# Patient Record
Sex: Male | Born: 1975 | Race: White | Hispanic: No | Marital: Married | Smoking: Current every day smoker
Health system: Southern US, Community
[De-identification: ages and names within clinical notes are randomized; demographics above are authoritative.]

## PROBLEM LIST (undated history)

## (undated) DIAGNOSIS — E785 Hyperlipidemia, unspecified: Secondary | ICD-10-CM

## (undated) DIAGNOSIS — F32A Depression, unspecified: Secondary | ICD-10-CM

## (undated) DIAGNOSIS — F1910 Other psychoactive substance abuse, uncomplicated: Secondary | ICD-10-CM

## (undated) DIAGNOSIS — F329 Major depressive disorder, single episode, unspecified: Secondary | ICD-10-CM

## (undated) DIAGNOSIS — F419 Anxiety disorder, unspecified: Secondary | ICD-10-CM

## (undated) DIAGNOSIS — E079 Disorder of thyroid, unspecified: Secondary | ICD-10-CM

---

## 2006-06-10 ENCOUNTER — Emergency Department (HOSPITAL_COMMUNITY): Admission: EM | Admit: 2006-06-10 | Discharge: 2006-06-10 | Payer: Self-pay | Admitting: Emergency Medicine

## 2007-10-16 ENCOUNTER — Emergency Department (HOSPITAL_COMMUNITY): Admission: EM | Admit: 2007-10-16 | Discharge: 2007-10-17 | Payer: Self-pay | Admitting: Emergency Medicine

## 2010-03-27 ENCOUNTER — Emergency Department (HOSPITAL_COMMUNITY): Admission: EM | Admit: 2010-03-27 | Discharge: 2010-03-27 | Payer: Self-pay | Admitting: Emergency Medicine

## 2010-11-02 LAB — COMPREHENSIVE METABOLIC PANEL
Albumin: 4 g/dL (ref 3.5–5.2)
Alkaline Phosphatase: 82 U/L (ref 39–117)
BUN: 15 mg/dL (ref 6–23)
CO2: 25 mEq/L (ref 19–32)
Calcium: 9.1 mg/dL (ref 8.4–10.5)
Chloride: 102 mEq/L (ref 96–112)
Potassium: 3.6 mEq/L (ref 3.5–5.1)
Total Bilirubin: 1.5 mg/dL — ABNORMAL HIGH (ref 0.3–1.2)

## 2010-11-02 LAB — RAPID URINE DRUG SCREEN, HOSP PERFORMED: Amphetamines: NOT DETECTED

## 2010-11-02 LAB — CBC
MCH: 33.5 pg (ref 26.0–34.0)
MCHC: 35.9 g/dL (ref 30.0–36.0)
MCV: 93.5 fL (ref 78.0–100.0)
Platelets: 227 10*3/uL (ref 150–400)
RDW: 13.5 % (ref 11.5–15.5)
WBC: 13.8 10*3/uL — ABNORMAL HIGH (ref 4.0–10.5)

## 2010-11-02 LAB — URINALYSIS, ROUTINE W REFLEX MICROSCOPIC
Protein, ur: NEGATIVE mg/dL
Urobilinogen, UA: 1 mg/dL (ref 0.0–1.0)
pH: 6.5 (ref 5.0–8.0)

## 2011-05-10 LAB — BASIC METABOLIC PANEL
CO2: 23
Chloride: 101
GFR calc Af Amer: >60

## 2011-05-10 LAB — RAPID URINE DRUG SCREEN, HOSP PERFORMED
Barbiturates: NOT DETECTED
Benzodiazepines: POSITIVE — AB
Cocaine: POSITIVE — AB
Opiates: NOT DETECTED
Tetrahydrocannabinol: NOT DETECTED

## 2011-05-10 LAB — DIFFERENTIAL
Eosinophils Absolute: 0
Lymphocytes Relative: 24
Monocytes Absolute: 1.3 — ABNORMAL HIGH
Monocytes Relative: 12
Neutro Abs: 6.9
Neutrophils Relative %: 63

## 2011-05-10 LAB — CBC
MCV: 93.6
Platelets: 271
WBC: 10.9 — ABNORMAL HIGH

## 2011-05-10 LAB — URINALYSIS, ROUTINE W REFLEX MICROSCOPIC
Glucose, UA: NEGATIVE
Nitrite: NEGATIVE
Protein, ur: NEGATIVE
Specific Gravity, Urine: 1.031 — ABNORMAL HIGH
Urobilinogen, UA: 1
pH: 5.5

## 2011-05-10 LAB — URINE MICROSCOPIC-ADD ON

## 2012-04-27 ENCOUNTER — Ambulatory Visit: Payer: Self-pay | Admitting: Emergency Medicine

## 2012-04-27 VITALS — BP 120/68 | HR 68 | Temp 98.0°F | Resp 16 | Ht 76.0 in | Wt 228.0 lb

## 2012-04-27 DIAGNOSIS — Z0289 Encounter for other administrative examinations: Secondary | ICD-10-CM

## 2012-04-27 DIAGNOSIS — Z2839 Other underimmunization status: Secondary | ICD-10-CM

## 2012-04-27 NOTE — Progress Notes (Signed)
  Subjective:    Patient ID: Brandon [name], male    DOB: 16-Jul-1976, 36 y.o.   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 He has some records with him but not all of his immunization. Patient has no ongoing medical problems. He has a history of substance abuse but is currently been clean for over a year after attending rehabilitation.   Review of Systems     Objective:   Physical Exam HEENT exam is unremarkable. His neck supple chest is clear to patient is soft nontender.        Assessment & Plan:  I reviewed his immunization record. He is lacking and MMR did not have any contact to give titers for hepatitis B as well as titers for varicella.

## 2012-04-28 LAB — VARICELLA ZOSTER ANTIBODY, IGG: Varicella IgG: 2.53 {ISR} — ABNORMAL HIGH (ref <0.90)

## 2012-06-17 ENCOUNTER — Emergency Department (HOSPITAL_COMMUNITY)
Admission: EM | Admit: 2012-06-17 | Discharge: 2012-06-18 | Disposition: A | Discharge status: Home / Self Care | Payer: BC Managed Care – PPO | Attending: Emergency Medicine | Admitting: Emergency Medicine

## 2012-06-17 DIAGNOSIS — F1910 Other psychoactive substance abuse, uncomplicated: Secondary | ICD-10-CM

## 2012-06-17 DIAGNOSIS — F1410 Cocaine abuse, uncomplicated: Secondary | ICD-10-CM | POA: Insufficient documentation

## 2012-06-17 DIAGNOSIS — Z79899 Other long term (current) drug therapy: Secondary | ICD-10-CM | POA: Insufficient documentation

## 2012-06-17 DIAGNOSIS — F1010 Alcohol abuse, uncomplicated: Secondary | ICD-10-CM | POA: Insufficient documentation

## 2012-06-17 DIAGNOSIS — F17200 Nicotine dependence, unspecified, uncomplicated: Secondary | ICD-10-CM | POA: Insufficient documentation

## 2012-06-17 NOTE — ED Notes (Signed)
Pt called for triage  No response noted from lobby

## 2012-06-18 ENCOUNTER — Encounter (HOSPITAL_COMMUNITY): Payer: Self-pay | Admitting: Emergency Medicine

## 2012-06-18 LAB — CBC
Hemoglobin: 15.4 g/dL (ref 13.0–17.0)
MCHC: 35.6 g/dL (ref 30.0–36.0)
Platelets: 275 10*3/uL (ref 150–400)
RBC: 4.88 MIL/uL (ref 4.22–5.81)
RDW: 12.7 % (ref 11.5–15.5)
WBC: 11.7 10*3/uL — ABNORMAL HIGH (ref 4.0–10.5)

## 2012-06-18 LAB — COMPREHENSIVE METABOLIC PANEL
CO2: 22 mEq/L (ref 19–32)
Calcium: 9.5 mg/dL (ref 8.4–10.5)
Chloride: 95 mEq/L — ABNORMAL LOW (ref 96–112)
Creatinine, Ser: 1.11 mg/dL (ref 0.50–1.35)
GFR calc non Af Amer: 85 mL/min — ABNORMAL LOW (ref >90)
Glucose, Bld: 88 mg/dL (ref 70–99)
Potassium: 4.4 mEq/L (ref 3.5–5.1)
Total Bilirubin: 1.6 mg/dL — ABNORMAL HIGH (ref 0.3–1.2)

## 2012-06-18 LAB — RAPID URINE DRUG SCREEN, HOSP PERFORMED
Amphetamines: NOT DETECTED
Benzodiazepines: NOT DETECTED
Cocaine: POSITIVE — AB
Opiates: NOT DETECTED
Tetrahydrocannabinol: NOT DETECTED

## 2012-06-18 LAB — ETHANOL: Alcohol, Ethyl (B): 70 mg/dL — ABNORMAL HIGH (ref 0–11)

## 2012-06-18 LAB — SALICYLATE LEVEL: Salicylate Lvl: <2 mg/dL — ABNORMAL LOW (ref 2.8–20.0)

## 2012-06-18 MED ORDER — LORAZEPAM 1 MG PO TABS
0.0000 mg | ORAL_TABLET | Freq: Four times a day (QID) | ORAL | Status: DC
  Administered 2012-06-18: 1 mg via ORAL
  Administered 2012-06-18: 2 mg via ORAL
  Filled 2012-06-18: qty 2
  Filled 2012-06-18: qty 1

## 2012-06-18 MED ORDER — LORAZEPAM 1 MG PO TABS: 1.0000 mg | ORAL_TABLET | Freq: Three times a day (TID) | ORAL | Status: DC | PRN

## 2012-06-18 MED ORDER — NICOTINE 21 MG/24HR TD PT24
21.0000 mg | MEDICATED_PATCH | Freq: Once | TRANSDERMAL | Status: DC
  Administered 2012-06-18: 21 mg via TRANSDERMAL
  Filled 2012-06-18: qty 1

## 2012-06-18 MED ORDER — PANTOPRAZOLE SODIUM 40 MG PO TBEC
40.0000 mg | DELAYED_RELEASE_TABLET | Freq: Every day | ORAL | Status: DC
  Administered 2012-06-18: 40 mg via ORAL
  Filled 2012-06-18: qty 1

## 2012-06-18 MED ORDER — VITAMIN B-1 100 MG PO TABS
100.0000 mg | ORAL_TABLET | Freq: Every day | ORAL | Status: DC
  Administered 2012-06-18: 100 mg via ORAL
  Filled 2012-06-18: qty 1

## 2012-06-18 MED ORDER — IBUPROFEN 600 MG PO TABS: 600.0000 mg | ORAL_TABLET | Freq: Three times a day (TID) | ORAL | Status: DC | PRN

## 2012-06-18 MED ORDER — LORAZEPAM 2 MG/ML IJ SOLN: 1.0000 mg | Freq: Four times a day (QID) | INTRAMUSCULAR | Status: DC | PRN

## 2012-06-18 MED ORDER — ACETAMINOPHEN 325 MG PO TABS: 650.0000 mg | ORAL_TABLET | ORAL | Status: DC | PRN

## 2012-06-18 MED ORDER — ALUM & MAG HYDROXIDE-SIMETH 200-200-20 MG/5ML PO SUSP: 30.0000 mL | ORAL | Status: DC | PRN

## 2012-06-18 MED ORDER — FOLIC ACID 1 MG PO TABS
1.0000 mg | ORAL_TABLET | Freq: Every day | ORAL | Status: DC
  Administered 2012-06-18: 1 mg via ORAL
  Filled 2012-06-18: qty 1

## 2012-06-18 MED ORDER — DULOXETINE HCL 60 MG PO CPEP
60.0000 mg | ORAL_CAPSULE | Freq: Every day | ORAL | Status: DC
  Administered 2012-06-18: 60 mg via ORAL
  Filled 2012-06-18 (×2): qty 1

## 2012-06-18 MED ORDER — NICOTINE 21 MG/24HR TD PT24: 21.0000 mg | MEDICATED_PATCH | Freq: Every day | TRANSDERMAL | Status: DC

## 2012-06-18 MED ORDER — ADULT MULTIVITAMIN W/MINERALS CH
1.0000 | ORAL_TABLET | Freq: Every day | ORAL | Status: DC
  Administered 2012-06-18: 1 via ORAL
  Filled 2012-06-18: qty 1

## 2012-06-18 MED ORDER — LORAZEPAM 1 MG PO TABS
1.0000 mg | ORAL_TABLET | Freq: Four times a day (QID) | ORAL | Status: DC | PRN
  Administered 2012-06-18: 1 mg via ORAL
  Filled 2012-06-18: qty 1

## 2012-06-18 MED ORDER — THIAMINE HCL 100 MG/ML IJ SOLN: 100.0000 mg | Freq: Every day | INTRAMUSCULAR | Status: DC

## 2012-06-18 MED ORDER — LORAZEPAM 1 MG PO TABS: 0.0000 mg | ORAL_TABLET | Freq: Two times a day (BID) | ORAL | Status: DC

## 2012-06-18 MED ORDER — ONDANSETRON HCL 4 MG PO TABS
4.0000 mg | ORAL_TABLET | Freq: Three times a day (TID) | ORAL | Status: DC | PRN
  Administered 2012-06-18: 4 mg via ORAL
  Filled 2012-06-18: qty 1

## 2012-06-18 MED ORDER — LORAZEPAM 1 MG PO TABS
1.0000 mg | ORAL_TABLET | Freq: Once | ORAL | Status: AC
  Administered 2012-06-18: 1 mg via ORAL
  Filled 2012-06-18: qty 1

## 2012-06-18 MED ORDER — ZOLPIDEM TARTRATE 5 MG PO TABS: 5.0000 mg | ORAL_TABLET | Freq: Every evening | ORAL | Status: DC | PRN

## 2012-06-18 MED ORDER — MENTHOL 3 MG MT LOZG
1.0000 | LOZENGE | OROMUCOSAL | Status: DC | PRN
  Filled 2012-06-18: qty 9

## 2012-06-18 NOTE — ED Provider Notes (Signed)
History     CSN: [id number]  Arrival date & time 06/17/12  2349   First MD Initiated Contact with Patient 06/18/12 0320      Chief Complaint  Patient presents with  . Medical Clearance    (Consider location/radiation/quality/duration/timing/severity/associated sxs/prior treatment) HPI HX per PT. clean for 14 months, full time student, family in town, relapsed 3 days ago, wants detox from crack, alcohol.  He denies any other drugs, last drink tonight, last drug use about one hour PTA. No sleep in 3 days, depressed and anxious, no active SI.  History reviewed. No pertinent past medical history.  History reviewed. No pertinent past surgical history.  Family History  Problem Relation Age of Onset  . Diabetes Father     History  Substance Use Topics  . Smoking status: Current Every Day Smoker    Types: Cigarettes  . Smokeless tobacco: Not on file  . Alcohol Use: Yes      Review of Systems  Constitutional: Negative for fever and chills.  HENT: Negative for neck pain and neck stiffness.   Eyes: Negative for pain.  Respiratory: Negative for shortness of breath.   Cardiovascular: Negative for chest pain.  Gastrointestinal: Negative for abdominal pain.  Genitourinary: Negative for dysuria.  Musculoskeletal: Negative for back pain.  Skin: Negative for rash.  Neurological: Negative for headaches.  Psychiatric/Behavioral: Negative for self-injury.  All other systems reviewed and are negative.    Allergies  Review of patient's allergies indicates no known allergies.  Home Medications   Current Outpatient Rx  Name Route Sig Dispense Refill  . DULOXETINE HCL 60 MG PO CPEP Oral Take 60 mg by mouth daily.    Marland Kitchen OMEPRAZOLE 20 MG PO CPDR Oral Take 20 mg by mouth daily.      BP 130/86  Pulse 110  Temp 99.6 F (37.6 C) (Oral)  Resp 18  SpO2 100%  Physical Exam  Constitutional: He is oriented to person, place, and time. He appears well-developed and well-nourished.    HENT:  Head: Normocephalic and atraumatic.  Eyes: Conjunctivae normal and EOM are normal. Pupils are equal, round, and reactive to light.  Neck: Trachea normal. Neck supple. No thyromegaly present.  Cardiovascular: Normal rate, regular rhythm, S1 normal, S2 normal and normal pulses.     No systolic murmur is present   No diastolic murmur is present  Pulses:      Radial pulses are 2+ on the right side, and 2+ on the left side.  Pulmonary/Chest: Effort normal and breath sounds normal. He has no wheezes. He has no rhonchi. He has no rales. He exhibits no tenderness.  Abdominal: Soft. Normal appearance and bowel sounds are normal. There is no tenderness. There is no CVA tenderness and negative Murphy's sign.  Musculoskeletal:       UE tremors  Neurological: He is alert and oriented to person, place, and time. He has normal strength. No cranial nerve deficit or sensory deficit. GCS eye subscore is 4. GCS verbal subscore is 5. GCS motor subscore is 6.  Skin: Skin is warm and dry. No rash noted. He is not diaphoretic.  Psychiatric: His speech is normal.       anxious    ED Course  Procedures (including critical care time)  Results for orders placed during the hospital encounter of 06/17/12  ACETAMINOPHEN LEVEL      Component Value Range   Acetaminophen (Tylenol), Serum <15.0  10 - 30 ug/mL  CBC  Component Value Range   WBC 11.7 (*) 4.0 - 10.5 K/uL   RBC 4.88  4.22 - 5.81 MIL/uL   Hemoglobin 15.4  13.0 - 17.0 g/dL   HCT 56.2  13.0 - 86.5 %   MCV 88.7  78.0 - 100.0 fL   MCH 31.6  26.0 - 34.0 pg   MCHC 35.6  30.0 - 36.0 g/dL   RDW 78.4  69.6 - 29.5 %   Platelets 275  150 - 400 K/uL  COMPREHENSIVE METABOLIC PANEL      Component Value Range   Sodium 136  135 - 145 mEq/L   Potassium 4.4  3.5 - 5.1 mEq/L   Chloride 95 (*) 96 - 112 mEq/L   CO2 22  19 - 32 mEq/L   Glucose, Bld 88  70 - 99 mg/dL   BUN 28 (*) 6 - 23 mg/dL   Creatinine, Ser 2.84  0.50 - 1.35 mg/dL   Calcium 9.5  8.4  - 13.2 mg/dL   Total Protein 7.7  6.0 - 8.3 g/dL   Albumin 4.8  3.5 - 5.2 g/dL   AST 92 (*) 0 - 37 U/L   ALT 49  0 - 53 U/L   Alkaline Phosphatase 78  39 - 117 U/L   Total Bilirubin 1.6 (*) 0.3 - 1.2 mg/dL   GFR calc non Af Amer 85 (*) >90 mL/min   GFR calc Af Amer >90  >90 mL/min  ETHANOL      Component Value Range   Alcohol, Ethyl (B) 70 (*) 0 - 11 mg/dL  SALICYLATE LEVEL      Component Value Range   Salicylate Lvl <2.0 (*) 2.8 - 20.0 mg/dL  URINE RAPID DRUG SCREEN (HOSP PERFORMED)      Component Value Range   Opiates NONE DETECTED  NONE DETECTED   Cocaine POSITIVE (*) NONE DETECTED   Benzodiazepines NONE DETECTED  NONE DETECTED   Amphetamines NONE DETECTED  NONE DETECTED   Tetrahydrocannabinol NONE DETECTED  NONE DETECTED   Barbiturates NONE DETECTED  NONE DETECTED   ACT notified - will assess for possible placement. ED [facility] holding orders and withdrawal orders initiated.   MDM    Polysubstance abuse requesting help with detox. No SI/HI. Labs, UA and UDS reviewed.         Sunnie Nielsen, MD 06/19/12 [phone]

## 2012-06-18 NOTE — ED Notes (Signed)
MD at bedside. 

## 2012-06-18 NOTE — ED Provider Notes (Signed)
2145.  Pt stable, NAD.  He has been provided a resource guide to assist with obtaining further assistance with substance abuse issues.  He has no issues with SI/HI.  Plan discharge home.  Tobin Chad, MD 06/18/12 2146

## 2012-06-18 NOTE — ED Notes (Signed)
Pt states he is here for detox from alcohol and cocaine  Pt states he was clean for 14 months  Pt states he has been using for the past 3 days   Pt states last drank about an hour and a half ago and last used cocaine about 2 hrs ago

## 2012-06-18 NOTE — ED Notes (Signed)
The ativan 1mg  was given PO, not IV A. Hart Rochester, RN

## 2012-06-18 NOTE — ED Notes (Signed)
Patient discharge with written and verbal instructions. Respirations equal and unlabored. Skin warm and dry. No acute distress noted.

## 2012-06-18 NOTE — ED Notes (Signed)
Patient states he's here for detox from crack and alcohol. He states his last detox was 2 years ago and he was sober for 14 months. He relates a lot of personal problems. He states he last used just prior to arrival. He denies any symptoms of withdrawal at this point has no tremor. He is waiting to have act team assessment this morning.  Ward Givens, MD 06/18/12 [phone]

## 2012-06-19 NOTE — BH Assessment (Signed)
Assessment Note   Brandon [name] is a 36 y.o. male who presents to [facility] with alcohol and crack abuse.  Pt denies SI/HI/Psych.  Pt told this Clinical research associate that he has been sober for 14 months and relapsed recently because pressures from school and work.  Pt was employed with [employer] and has been terminated due to relapse--"I haven't been taking care of myself".  Pt says he's attending [employer] school of social work.  Pt says he relapsed 3 days ago and has been drinking 1/2 gal of rum for 3 days.  Pt was previously a patient with [employer] and spent 3 mos in rehab and also was residing at the [facility].  Pt was requesting help with Rehab, this writer advised pt that behavorial health cannot assist with placement but can provide referrals and pt can pursue rehab.  Pt given referrals and d/c.   Axis I: Substance Abuse Axis II: Deferred Axis III: History reviewed. No pertinent past medical history. Axis IV: educational problems, other psychosocial or environmental problems, problems related to social environment and problems with primary support group Axis V: 51-60 moderate symptoms  Past Medical History: History reviewed. No pertinent past medical history.  History reviewed. No pertinent past surgical history.  Family History:  Family History  Problem Relation Age of Onset  . Diabetes Father     Social History:  reports that he has been smoking Cigarettes.  He does not have any smokeless tobacco history on file. He reports that he drinks alcohol. He reports that he uses illicit drugs ("Crack" cocaine).  Additional Social History:  Alcohol / Drug Use Pain Medications: None  Prescriptions: None  Over the Counter: None  History of alcohol / drug use?: Yes Longest period of sobriety (when/how long): 14 mos  Negative Consequences of Use: Personal relationships;Work / Mining engineer #1 Name of Substance 1: Alcohol  1 - Age of First Use: Teens  1 - Amount (size/oz): 1/2 Gal  1 -  Frequency: Daily  1 - Duration: 3 Days  1 - Last Use / Amount: 06/18/12 Substance #2 Name of Substance 2: Crack  2 - Age of First Use: Teens  2 - Amount (size/oz): Unk  2 - Frequency: Unk  2 - Duration: 3 Days  2 - Last Use / Amount: 06/18/12  CIWA: CIWA-Ar BP: 93/58 mmHg Pulse Rate: 90  Nausea and Vomiting: no nausea and no vomiting Tactile Disturbances: none Tremor: no tremor Auditory Disturbances: not present Paroxysmal Sweats: no sweat visible Visual Disturbances: not present Anxiety: no anxiety, at ease Headache, Fullness in Head: none present Agitation: normal activity Orientation and Clouding of Sensorium: oriented and can do serial additions CIWA-Ar Total: 0  COWS:    Allergies: No Known Allergies  Home Medications:  (Not in a hospital admission)  OB/GYN Status:  No LMP for male patient.  General Assessment Data Location of Assessment: [facility] ED Living Arrangements: Alone Can pt return to current living arrangement?: Yes Admission Status: Voluntary Is patient capable of signing voluntary admission?: Yes Transfer from: Acute Hospital Referral Source: MD  Education Status Is patient currently in school?: No Current Grade: None  Highest grade of school patient has completed: None  Name of school: None  Contact person: None   Risk to self Suicidal Ideation: No Suicidal Intent: No Is patient at risk for suicide?: No Suicidal Plan?: No Access to Means: No What has been your use of drugs/alcohol within the last 12 months?: Abusing alcohol/crack  Previous Attempts/Gestures: No How  many times?: 0  Other Self Harm Risks: None  Triggers for Past Attempts: None known Intentional Self Injurious Behavior: None Family Suicide History: No Recent stressful life event(s): Other (Comment) (Relapsed on Alcohol/Crack ) Persecutory voices/beliefs?: No Depression: Yes Depression Symptoms: Guilt Substance abuse history and/or treatment for substance abuse?: Yes Suicide  prevention information given to non-admitted patients: Not applicable  Risk to Others Homicidal Ideation: No Thoughts of Harm to Others: No Current Homicidal Intent: No Current Homicidal Plan: No Access to Homicidal Means: No Identified Victim: None  History of harm to others?: No Assessment of Violence: None Noted Violent Behavior Description: None  Does patient have access to weapons?: No Criminal Charges Pending?: No Does patient have a court date: No  Psychosis Hallucinations: None noted Delusions: None noted  Mental Status Report Appear/Hygiene: Disheveled Eye Contact: Poor Motor Activity: Unremarkable Speech: Logical/coherent Level of Consciousness: Alert Mood: Depressed Affect: Depressed;Appropriate to circumstance Anxiety Level: None Thought Processes: Coherent;Relevant Judgement: Unimpaired Orientation: Person;Place;Time;Situation Obsessive Compulsive Thoughts/Behaviors: None  Cognitive Functioning Concentration: Normal Memory: Recent Intact;Remote Intact IQ: Average Insight: Fair Impulse Control: Fair Appetite: Good Weight Loss: 0  Weight Gain: 0  Sleep: No Change Total Hours of Sleep: 6  Vegetative Symptoms: None  ADLScreening [facility] Assessment Services) Patient's cognitive ability adequate to safely complete daily activities?: Yes Patient able to express need for assistance with ADLs?: Yes Independently performs ADLs?: Yes (appropriate for developmental age)  Abuse/Neglect [facility]) Physical Abuse: Denies Verbal Abuse: Denies Sexual Abuse: Denies  Prior Inpatient Therapy Prior Inpatient Therapy: Yes Prior Therapy Dates: 2013 Prior Therapy Facilty/Provider(s): [facility] [place], [place]([address]) Reason for Treatment: Detox/rehab   Prior Outpatient Therapy Prior Outpatient Therapy: No Prior Therapy Dates: None  Prior Therapy Facilty/Provider(s): None  Reason for Treatment: None   ADL Screening (condition at time of admission) Patient's  cognitive ability adequate to safely complete daily activities?: Yes Patient able to express need for assistance with ADLs?: Yes Independently performs ADLs?: Yes (appropriate for developmental age) Weakness of Legs: None Weakness of Arms/Hands: None  Home Assistive Devices/Equipment Home Assistive Devices/Equipment: None  Therapy Consults (therapy consults require a physician order) PT Evaluation Needed: No OT Evalulation Needed: No SLP Evaluation Needed: No Abuse/Neglect Assessment (Assessment to be complete while patient is alone) Physical Abuse: Denies Verbal Abuse: Denies Sexual Abuse: Denies Exploitation of patient/patient's resources: Denies Self-Neglect: Denies Values / Beliefs Cultural Requests During Hospitalization: None Spiritual Requests During Hospitalization: None Consults Spiritual Care Consult Needed: No Social Work Consult Needed: No Merchant navy officer (For Healthcare) Advance Directive: Patient does not have advance directive;Patient would not like information Pre-existing out of facility DNR order (yellow form or pink MOST form): No Nutrition Screen- [facility] Patient's home diet: Regular Have you recently lost weight without trying?: No Have you been eating poorly because of a decreased appetite?: No Malnutrition Screening Tool Score: 0   Additional Information 1:1 In Past 12 Months?: No CIRT Risk: No Elopement Risk: No Does patient have medical clearance?: Yes     Disposition:  Disposition Disposition of Patient: Referred to;Outpatient treatment (Outpatient rehab referrals ) Type of outpatient treatment:  (Outpatient referrals) Patient referred to: Outpatient clinic referral  On Site Evaluation by:   Reviewed with Physician:     Beatrix Shipper C 06/19/2012 1:04 AM

## 2015-03-13 ENCOUNTER — Encounter: Payer: Self-pay | Admitting: Emergency Medicine

## 2015-03-13 ENCOUNTER — Emergency Department (INDEPENDENT_AMBULATORY_CARE_PROVIDER_SITE_OTHER): Payer: Self-pay

## 2015-03-13 ENCOUNTER — Emergency Department
Admission: EM | Admit: 2015-03-13 | Discharge: 2015-03-13 | Disposition: A | Discharge status: Home / Self Care | Payer: Self-pay | Source: Home / Self Care | Attending: Family Medicine | Admitting: Family Medicine

## 2015-03-13 DIAGNOSIS — S0003XA Contusion of scalp, initial encounter: Secondary | ICD-10-CM

## 2015-03-13 DIAGNOSIS — R42 Dizziness and giddiness: Secondary | ICD-10-CM

## 2015-03-13 DIAGNOSIS — S2232XA Fracture of one rib, left side, initial encounter for closed fracture: Secondary | ICD-10-CM

## 2015-03-13 DIAGNOSIS — X58XXXA Exposure to other specified factors, initial encounter: Secondary | ICD-10-CM

## 2015-03-13 DIAGNOSIS — T148 Other injury of unspecified body region: Secondary | ICD-10-CM

## 2015-03-13 DIAGNOSIS — T148XXA Other injury of unspecified body region, initial encounter: Secondary | ICD-10-CM

## 2015-03-13 DIAGNOSIS — T07XXXA Unspecified multiple injuries, initial encounter: Secondary | ICD-10-CM

## 2015-03-13 HISTORY — DX: Depression, unspecified: F32.A

## 2015-03-13 HISTORY — DX: Major depressive disorder, single episode, unspecified: F32.9

## 2015-03-13 HISTORY — DX: Other psychoactive substance abuse, uncomplicated: F19.10

## 2015-03-13 HISTORY — DX: Anxiety disorder, unspecified: F41.9

## 2015-03-13 HISTORY — DX: Hyperlipidemia, unspecified: E78.5

## 2015-03-13 HISTORY — DX: Disorder of thyroid, unspecified: E07.9

## 2015-03-13 MED ORDER — ONDANSETRON 4 MG PO TBDP: 4.0000 mg | ORAL_TABLET | Freq: Once | ORAL | Status: DC

## 2015-03-13 MED ORDER — MELOXICAM 7.5 MG PO TABS: ORAL_TABLET | ORAL | Status: DC

## 2015-03-13 MED ORDER — CYCLOBENZAPRINE HCL 10 MG PO TABS: 10.0000 mg | ORAL_TABLET | Freq: Two times a day (BID) | ORAL | Status: DC | PRN

## 2015-03-13 MED ORDER — ONDANSETRON HCL 4 MG PO TABS: 4.0000 mg | ORAL_TABLET | Freq: Four times a day (QID) | ORAL | Status: DC
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
## 2015-03-13 NOTE — Discharge Instructions (Signed)
Flexeril is a muscle relaxer and may cause drowsiness. Do not drink alcohol, drive, or operate heavy machinery while taking. Meloxicam (Mobic) is an antiinflammatory to help with pain and inflammation.  Do not take ibuprofen, Advil, Aleve, or any other medications that contain NSAIDs while taking meloxicam as this may cause stomach upset or even ulcers if taken in large amounts for an extended period of time.   Please follow-up with a primary care provider in one week if not improving.   Please call 911 or go to closest emergency department if symptoms significantly worsen including difficulty breathing, difficulty walking or other new concerning symptoms of

## 2017-10-13 ENCOUNTER — Emergency Department (HOSPITAL_COMMUNITY)
Admission: EM | Admit: 2017-10-13 | Discharge: 2017-10-14 | Disposition: A | Discharge status: Other Acute Inpatient Hospital | Payer: Self-pay | Attending: Emergency Medicine | Admitting: Emergency Medicine

## 2017-10-13 DIAGNOSIS — Z79899 Other long term (current) drug therapy: Secondary | ICD-10-CM | POA: Insufficient documentation

## 2017-10-13 DIAGNOSIS — F329 Major depressive disorder, single episode, unspecified: Secondary | ICD-10-CM | POA: Insufficient documentation

## 2017-10-13 DIAGNOSIS — R441 Visual hallucinations: Secondary | ICD-10-CM | POA: Insufficient documentation

## 2017-10-13 DIAGNOSIS — R45851 Suicidal ideations: Secondary | ICD-10-CM | POA: Insufficient documentation

## 2017-10-13 DIAGNOSIS — F1410 Cocaine abuse, uncomplicated: Secondary | ICD-10-CM | POA: Insufficient documentation

## 2017-10-13 DIAGNOSIS — F17210 Nicotine dependence, cigarettes, uncomplicated: Secondary | ICD-10-CM | POA: Insufficient documentation

## 2017-10-13 DIAGNOSIS — T43622A Poisoning by amphetamines, intentional self-harm, initial encounter: Secondary | ICD-10-CM | POA: Insufficient documentation

## 2017-10-13 LAB — COMPREHENSIVE METABOLIC PANEL
ALK PHOS: 71 U/L (ref 38–126)
ALT: 54 U/L (ref 17–63)
AST: 57 U/L — AB (ref 15–41)
Albumin: 4.1 g/dL (ref 3.5–5.0)
Anion gap: 10 (ref 5–15)
BUN: 13 mg/dL (ref 6–20)
CO2: 22 mmol/L (ref 22–32)
CREATININE: 0.86 mg/dL (ref 0.61–1.24)
Calcium: 8.9 mg/dL (ref 8.9–10.3)
Chloride: 104 mmol/L (ref 101–111)
GFR calc non Af Amer: >60 mL/min (ref >60)
GLUCOSE: 93 mg/dL (ref 65–99)
Potassium: 4.8 mmol/L (ref 3.5–5.1)
SODIUM: 136 mmol/L (ref 135–145)
Total Bilirubin: 1.6 mg/dL — ABNORMAL HIGH (ref 0.3–1.2)
Total Protein: 6.9 g/dL (ref 6.5–8.1)

## 2017-10-13 LAB — SALICYLATE LEVEL

## 2017-10-13 LAB — RAPID URINE DRUG SCREEN, HOSP PERFORMED
Amphetamines: NOT DETECTED
BARBITURATES: POSITIVE — AB
Benzodiazepines: NOT DETECTED
Cocaine: NOT DETECTED
Opiates: NOT DETECTED
Tetrahydrocannabinol: NOT DETECTED

## 2017-10-13 LAB — CBC
HEMATOCRIT: 41.4 % (ref 39.0–52.0)
Hemoglobin: 14.1 g/dL (ref 13.0–17.0)
MCH: 30.3 pg (ref 26.0–34.0)
MCHC: 34.1 g/dL (ref 30.0–36.0)
MCV: 88.8 fL (ref 78.0–100.0)
Platelets: 318 10*3/uL (ref 150–400)
RBC: 4.66 MIL/uL (ref 4.22–5.81)
RDW: 14.8 % (ref 11.5–15.5)
WBC: 6.7 10*3/uL (ref 4.0–10.5)

## 2017-10-13 LAB — ETHANOL: Alcohol, Ethyl (B): <10 mg/dL (ref <10)

## 2017-10-13 LAB — ACETAMINOPHEN LEVEL: Acetaminophen (Tylenol), Serum: <10 ug/mL — ABNORMAL LOW (ref 10–30)

## 2017-10-13 LAB — CBG MONITORING, ED: Glucose-Capillary: 90 mg/dL (ref 65–99)

## 2017-10-13 MED ORDER — TRAZODONE HCL 100 MG PO TABS
100.0000 mg | ORAL_TABLET | Freq: Every day | ORAL | Status: DC
  Administered 2017-10-13: 100 mg via ORAL
  Filled 2017-10-13: qty 1

## 2017-10-13 MED ORDER — SODIUM CHLORIDE 0.9 % IV SOLN
Freq: Once | INTRAVENOUS | Status: AC
  Administered 2017-10-13: 22:00:00 via INTRAVENOUS

## 2017-10-13 MED ORDER — LEVOTHYROXINE SODIUM 100 MCG PO TABS
100.0000 ug | ORAL_TABLET | Freq: Every day | ORAL | Status: DC
  Administered 2017-10-14: 100 ug via ORAL
  Filled 2017-10-13: qty 1

## 2017-10-13 MED ORDER — ONDANSETRON HCL 4 MG PO TABS: 4.0000 mg | ORAL_TABLET | Freq: Three times a day (TID) | ORAL | Status: DC | PRN

## 2017-10-13 MED ORDER — SODIUM CHLORIDE 0.9 % IV BOLUS (SEPSIS)
1000.0000 mL | Freq: Once | INTRAVENOUS | Status: AC
  Administered 2017-10-13: 1000 mL via INTRAVENOUS

## 2017-10-13 MED ORDER — LORAZEPAM 2 MG/ML IJ SOLN
1.0000 mg | Freq: Once | INTRAMUSCULAR | Status: AC
  Administered 2017-10-13: 1 mg via INTRAVENOUS
  Filled 2017-10-13: qty 1

## 2017-10-13 MED ORDER — PANTOPRAZOLE SODIUM 40 MG PO TBEC
40.0000 mg | DELAYED_RELEASE_TABLET | Freq: Every day | ORAL | Status: DC
  Administered 2017-10-14: 40 mg via ORAL
  Filled 2017-10-13: qty 1

## 2017-10-13 MED ORDER — QUETIAPINE FUMARATE 300 MG PO TABS
300.0000 mg | ORAL_TABLET | Freq: Every day | ORAL | Status: DC
  Administered 2017-10-13: 300 mg via ORAL
  Filled 2017-10-13: qty 1

## 2017-10-13 NOTE — ED Notes (Signed)
Patients belongings are in the cabinets marked "resus A/ 16-18". Safety sitter made aware.

## 2017-10-13 NOTE — ED Provider Notes (Addendum)
12:00 AM  Assumed care from Dr. Penne LashIssacs.  Patient is a 42 year old male who overdosed on Nuvigil.  Poison control recommended monitoring patient for 8 hours.  We will reassess at 4 AM.  Once medically cleared, will consult TTS.  4:10 AM  Pt is medically cleared and medically stable.  No complaints at this time.  We will consult TTS for their evaluation for disposition.  I reviewed all nursing notes, vitals, pertinent previous records, EKGs, lab and urine results, imaging (as available).    Meia Emley, Layla MawKristen N, DO 10/14/17 0411   5:49 AM  D/w Vikki PortsValerie with TTS.  They recommend inpatient psych admission.  He is here voluntarily.   Kimblery Diop, Layla MawKristen N, DO 10/14/17 [phone]

## 2017-10-13 NOTE — ED Notes (Addendum)
Bed: RESB Expected date:  Expected time:  Means of arrival:  Comments[facility]took Nuvigil 250 mg x 16 tabs over the last 24-30 hours-Poison control states -observe for tachycardia- -Hypertension -IVF's -Treat agitation with benzo's -Observe for 8 hours

## 2017-10-13 NOTE — ED Notes (Signed)
Pt belongings placed in cabinet behind nurses desk with pt label. 1 belonging bag.

## 2017-10-13 NOTE — ED Triage Notes (Signed)
Pt BIB [facility]. [facility] where [facility] tried to go in a voluntary. Pt will be IVC pt [facility]. Pt took around 16 nuvigil to try and feel better.  Pt having auditory and visual hallucinations. Pt has not taken his seroquel for a few days. EMS gave 5 mg haldol en route for anxiety. 18 ga left hand 500 cc bag hung.
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
## 2017-10-14 ENCOUNTER — Other Ambulatory Visit: Payer: Self-pay

## 2017-10-14 ENCOUNTER — Encounter (HOSPITAL_COMMUNITY): Payer: Self-pay | Admitting: *Deleted

## 2017-10-14 ENCOUNTER — Inpatient Hospital Stay (HOSPITAL_COMMUNITY)
Admission: AD | Admit: 2017-10-14 | Discharge: 2017-10-21 | DRG: 885 | Disposition: A | Discharge status: Rehabilitation Facility | Payer: Federal, State, Local not specified - Other | Attending: Psychiatry | Admitting: Psychiatry

## 2017-10-14 DIAGNOSIS — F17210 Nicotine dependence, cigarettes, uncomplicated: Secondary | ICD-10-CM | POA: Diagnosis not present

## 2017-10-14 DIAGNOSIS — Z91410 Personal history of adult physical and sexual abuse: Secondary | ICD-10-CM | POA: Diagnosis not present

## 2017-10-14 DIAGNOSIS — F1010 Alcohol abuse, uncomplicated: Secondary | ICD-10-CM | POA: Diagnosis present

## 2017-10-14 DIAGNOSIS — F1310 Sedative, hypnotic or anxiolytic abuse, uncomplicated: Secondary | ICD-10-CM | POA: Diagnosis present

## 2017-10-14 DIAGNOSIS — E785 Hyperlipidemia, unspecified: Secondary | ICD-10-CM | POA: Diagnosis present

## 2017-10-14 DIAGNOSIS — F909 Attention-deficit hyperactivity disorder, unspecified type: Secondary | ICD-10-CM | POA: Diagnosis present

## 2017-10-14 DIAGNOSIS — Z915 Personal history of self-harm: Secondary | ICD-10-CM | POA: Diagnosis not present

## 2017-10-14 DIAGNOSIS — F1490 Cocaine use, unspecified, uncomplicated: Secondary | ICD-10-CM | POA: Diagnosis not present

## 2017-10-14 DIAGNOSIS — F22 Delusional disorders: Secondary | ICD-10-CM | POA: Diagnosis present

## 2017-10-14 DIAGNOSIS — F332 Major depressive disorder, recurrent severe without psychotic features: Principal | ICD-10-CM | POA: Diagnosis present

## 2017-10-14 DIAGNOSIS — F315 Bipolar disorder, current episode depressed, severe, with psychotic features: Secondary | ICD-10-CM | POA: Diagnosis present

## 2017-10-14 DIAGNOSIS — G4700 Insomnia, unspecified: Secondary | ICD-10-CM | POA: Diagnosis present

## 2017-10-14 DIAGNOSIS — Z79899 Other long term (current) drug therapy: Secondary | ICD-10-CM | POA: Diagnosis not present

## 2017-10-14 DIAGNOSIS — R45851 Suicidal ideations: Secondary | ICD-10-CM | POA: Diagnosis present

## 2017-10-14 DIAGNOSIS — F4310 Post-traumatic stress disorder, unspecified: Secondary | ICD-10-CM

## 2017-10-14 DIAGNOSIS — K219 Gastro-esophageal reflux disease without esophagitis: Secondary | ICD-10-CM | POA: Diagnosis present

## 2017-10-14 DIAGNOSIS — E039 Hypothyroidism, unspecified: Secondary | ICD-10-CM | POA: Diagnosis present

## 2017-10-14 DIAGNOSIS — F1410 Cocaine abuse, uncomplicated: Secondary | ICD-10-CM | POA: Diagnosis present

## 2017-10-14 LAB — TSH: TSH: 1.304 u[IU]/mL (ref 0.350–4.500)

## 2017-10-14 MED ORDER — ACETAMINOPHEN 325 MG PO TABS
650.0000 mg | ORAL_TABLET | Freq: Four times a day (QID) | ORAL | Status: DC | PRN
  Administered 2017-10-14: 650 mg via ORAL
  Filled 2017-10-14: qty 2

## 2017-10-14 MED ORDER — TRAZODONE HCL 150 MG PO TABS
150.0000 mg | ORAL_TABLET | Freq: Every evening | ORAL | Status: DC | PRN
  Administered 2017-10-14 – 2017-10-18 (×9): 150 mg via ORAL
  Filled 2017-10-14 (×14): qty 1

## 2017-10-14 MED ORDER — NICOTINE 21 MG/24HR TD PT24
21.0000 mg | MEDICATED_PATCH | Freq: Once | TRANSDERMAL | Status: DC
  Administered 2017-10-14: 21 mg via TRANSDERMAL
  Filled 2017-10-14: qty 1

## 2017-10-14 MED ORDER — LEVOTHYROXINE SODIUM 100 MCG PO TABS
100.0000 ug | ORAL_TABLET | Freq: Every day | ORAL | Status: DC
  Administered 2017-10-15 – 2017-10-19 (×5): 100 ug via ORAL
  Filled 2017-10-14 (×8): qty 1

## 2017-10-14 MED ORDER — TRAZODONE HCL 100 MG PO TABS
100.0000 mg | ORAL_TABLET | Freq: Every day | ORAL | Status: DC
  Administered 2017-10-14: 100 mg via ORAL
  Filled 2017-10-14 (×2): qty 1

## 2017-10-14 MED ORDER — MAGNESIUM HYDROXIDE 400 MG/5ML PO SUSP
30.0000 mL | Freq: Every day | ORAL | Status: DC | PRN
  Administered 2017-10-15: 30 mL via ORAL
  Filled 2017-10-14: qty 30

## 2017-10-14 MED ORDER — ONDANSETRON HCL 4 MG PO TABS: 4.0000 mg | ORAL_TABLET | Freq: Three times a day (TID) | ORAL | Status: DC | PRN

## 2017-10-14 MED ORDER — IBUPROFEN 600 MG PO TABS
600.0000 mg | ORAL_TABLET | Freq: Four times a day (QID) | ORAL | Status: DC | PRN
  Administered 2017-10-14: 600 mg via ORAL
  Filled 2017-10-14: qty 1

## 2017-10-14 MED ORDER — ALUM & MAG HYDROXIDE-SIMETH 200-200-20 MG/5ML PO SUSP: 30.0000 mL | ORAL | Status: DC | PRN

## 2017-10-14 MED ORDER — HYDROXYZINE HCL 25 MG PO TABS
25.0000 mg | ORAL_TABLET | Freq: Three times a day (TID) | ORAL | Status: DC | PRN
  Administered 2017-10-14 – 2017-10-15 (×2): 25 mg via ORAL
  Filled 2017-10-14 (×3): qty 1

## 2017-10-14 MED ORDER — IBUPROFEN 200 MG PO TABS
600.0000 mg | ORAL_TABLET | Freq: Four times a day (QID) | ORAL | Status: DC | PRN
  Administered 2017-10-14: 600 mg via ORAL
  Filled 2017-10-14: qty 3

## 2017-10-14 MED ORDER — QUETIAPINE FUMARATE 300 MG PO TABS
300.0000 mg | ORAL_TABLET | Freq: Every day | ORAL | Status: DC
  Administered 2017-10-14: 300 mg via ORAL
  Filled 2017-10-14 (×3): qty 1

## 2017-10-14 MED ORDER — NICOTINE 21 MG/24HR TD PT24
21.0000 mg | MEDICATED_PATCH | Freq: Every day | TRANSDERMAL | Status: DC
  Administered 2017-10-15 – 2017-10-20 (×6): 21 mg via TRANSDERMAL
  Filled 2017-10-14 (×9): qty 1

## 2017-10-14 MED ORDER — PANTOPRAZOLE SODIUM 40 MG PO TBEC
40.0000 mg | DELAYED_RELEASE_TABLET | Freq: Every day | ORAL | Status: DC
  Administered 2017-10-15 – 2017-10-21 (×7): 40 mg via ORAL
  Filled 2017-10-14 (×10): qty 1
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
## 2017-10-14 NOTE — ED Notes (Signed)
SBAR Report received from previous nurse. Pt received calm and visible on unit. Pt endorses current SI (passive)  Depression 8/10, anxiety 5/10, paranoia, pt reports he was hallucinating earlier but not right now. Pt denies  pain at this time, was offered sandwich and appears otherwise stable and free of distress. Pt reminded of camera surveillance, q 15 min rounds, and rules of the milieu. Will continue to assess.

## 2017-10-14 NOTE — ED Notes (Signed)
Patient awake, alert & oriented. Vital signs WDL and patient states to be feeling better than before. Patient made the remark that he is still feeling "really low" and states that he is suicidal but denies a current plan.

## 2017-10-14 NOTE — ED Notes (Signed)
Pt reported that "everything" on television causes him paranoia so he is "trying not to watch TV". About 15 minutes later he asked for the TV to be turned on. He soon asked for the channel to be changed to the [employer]. He reports hearing voices, but is not responding to internal stimuli. He requested a muscle relaxer because, "All the fear that I've has been experiencing has caused my muscles to be sore".

## 2017-10-14 NOTE — BH Assessment (Signed)
[facility]  Per Juanetta BeetsJacqueline Norman, DO, this pt requires psychiatric hospitalization.  Malva LimesLinsey Strader, RN, [facility]; [facility] will call when they are ready to receive pt.  Pt presents under [facility], and upheld by Dr Sharma CovertNorman, and [facility].  Pt's nurse, Diane, has been notified, and agrees to call report to [phone].  Pt is to be transported via [employer] enforcement when the time comes.   Doylene Canninghomas Heide Brossart, [place] Behavioral Health Coordinator [phone]

## 2017-10-14 NOTE — Progress Notes (Signed)
Adult Psychoeducational Group Note  Date:  10/14/2017 Time:  8:36 PM  Group Topic/Focus:  Wrap-Up Group:   The focus of this group is to help patients review their daily goal of treatment and discuss progress on daily workbooks.  Participation Level:  Active  Participation Quality:  Appropriate  Affect:  Appropriate  Cognitive:  Appropriate  Insight: Appropriate  Engagement in Group:  Engaged  Modes of Intervention:  Discussion  Additional Comments:  The patient  Expressed that he rates today a 7.  Octavio Mannshigpen, [place] 10/14/2017, 8:36 PM
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
## 2017-10-14 NOTE — ED Notes (Signed)
[facility] to [facility]. [facility]

## 2017-10-14 NOTE — ED Notes (Signed)
[facility] Expected date:  Expected time:  Means of arrival:  Comments: WA 18

## 2017-10-14 NOTE — ED Notes (Signed)
Pt transported to [facility] by [employer]. All belongings returned to pt who signed for same. Pt was calm and cooperative.  

## 2017-10-14 NOTE — Progress Notes (Signed)
Brandon [name] is a 42 year old male pt admitted on involuntary basis. On admission, Brandon [name] indicates that he has been drinking, using crack cocaine and not taking his medications. He endorses auditory hallucinations and passive SI but able to contract for safety while in the hospital. He [facility] but reports that he has been non-compliant with medications since discharge. He reports that he is homeless and reports that he would like to get into a treatment center when he gets discharged from here. Anthem was oriented to the unit and safety maintained.

## 2017-10-14 NOTE — Tx Team (Signed)
Initial Treatment Plan 10/14/2017 3:44 PM Brandon ChromanKori [name] [id number]    PATIENT STRESSORS: Medication change or noncompliance Substance abuse   PATIENT STRENGTHS: Ability for insight Average or above average intelligence Capable of independent living General fund of knowledge Motivation for treatment/growth   PATIENT IDENTIFIED PROBLEMS: Depression Substance Abuse Auditory hallucinations "medication adjustment" "aftercare plan"                     DISCHARGE CRITERIA:  Ability to meet basic life and health needs Improved stabilization in mood, thinking, and/or behavior Verbal commitment to aftercare and medication compliance Withdrawal symptoms are absent or subacute and managed without 24-hour nursing intervention  PRELIMINARY DISCHARGE PLAN: Attend aftercare/continuing care group Placement in alternative living arrangements  PATIENT/FAMILY INVOLVEMENT: This treatment plan has been presented to and reviewed with the patient, Brandon [name], and/or family member, .  The patient and family have been given the opportunity to ask questions and make suggestions.  Tulio Facundo, [place], [place] 10/14/2017, 3:44 PM

## 2017-10-14 NOTE — Progress Notes (Signed)
Patient denied SI and HI, contracts for safety.  Denied A/V hallucinations.  Respirations even and unlabored.  No signs/symptoms of pain/distress noted on patient's face/body movements.  Safety maintained with 15 minute checks.

## 2017-10-15 DIAGNOSIS — R440 Auditory hallucinations: Secondary | ICD-10-CM

## 2017-10-15 DIAGNOSIS — F17210 Nicotine dependence, cigarettes, uncomplicated: Secondary | ICD-10-CM

## 2017-10-15 DIAGNOSIS — Z818 Family history of other mental and behavioral disorders: Secondary | ICD-10-CM

## 2017-10-15 DIAGNOSIS — F4310 Post-traumatic stress disorder, unspecified: Secondary | ICD-10-CM

## 2017-10-15 DIAGNOSIS — T1491XA Suicide attempt, initial encounter: Secondary | ICD-10-CM

## 2017-10-15 DIAGNOSIS — F909 Attention-deficit hyperactivity disorder, unspecified type: Secondary | ICD-10-CM

## 2017-10-15 DIAGNOSIS — Z62810 Personal history of physical and sexual abuse in childhood: Secondary | ICD-10-CM

## 2017-10-15 DIAGNOSIS — F315 Bipolar disorder, current episode depressed, severe, with psychotic features: Secondary | ICD-10-CM

## 2017-10-15 DIAGNOSIS — G4700 Insomnia, unspecified: Secondary | ICD-10-CM

## 2017-10-15 DIAGNOSIS — F419 Anxiety disorder, unspecified: Secondary | ICD-10-CM

## 2017-10-15 DIAGNOSIS — T50992A Poisoning by other drugs, medicaments and biological substances, intentional self-harm, initial encounter: Secondary | ICD-10-CM

## 2017-10-15 MED ORDER — PRAZOSIN HCL 1 MG PO CAPS
1.0000 mg | ORAL_CAPSULE | Freq: Every day | ORAL | Status: DC
  Administered 2017-10-15: 1 mg via ORAL
  Filled 2017-10-15 (×2): qty 1

## 2017-10-15 MED ORDER — OLANZAPINE 5 MG PO TBDP
5.0000 mg | ORAL_TABLET | Freq: Three times a day (TID) | ORAL | Status: DC | PRN
  Administered 2017-10-15: 5 mg via ORAL
  Filled 2017-10-15 (×2): qty 1

## 2017-10-15 MED ORDER — OLANZAPINE 10 MG PO TABS
10.0000 mg | ORAL_TABLET | Freq: Every day | ORAL | Status: DC
  Administered 2017-10-15 – 2017-10-20 (×6): 10 mg via ORAL
  Filled 2017-10-15 (×7): qty 1
  Filled 2017-10-15 (×2): qty 7

## 2017-10-15 MED ORDER — HYDROXYZINE HCL 50 MG PO TABS
50.0000 mg | ORAL_TABLET | Freq: Four times a day (QID) | ORAL | Status: DC | PRN
  Administered 2017-10-15 – 2017-10-20 (×10): 50 mg via ORAL
  Filled 2017-10-15 (×10): qty 1
  Filled 2017-10-15: qty 10

## 2017-10-15 MED ORDER — DULOXETINE HCL 30 MG PO CPEP
30.0000 mg | ORAL_CAPSULE | Freq: Every day | ORAL | Status: DC
  Administered 2017-10-15 – 2017-10-21 (×7): 30 mg via ORAL
  Filled 2017-10-15: qty 7
  Filled 2017-10-15 (×9): qty 1
  Filled 2017-10-15: qty 7

## 2017-10-15 NOTE — Progress Notes (Signed)
Patient states that he had a pretty good day overall. He credited his positive day to being able to talk to his doctor and having his medication ordered. As for the theme of the day, his personal development will include working on his long-term recovery and remaining healthy.

## 2017-10-15 NOTE — [facility] Suicide Risk Assessment (Signed)
[facility] INPATIENT:  Family/Significant Other Suicide Prevention Education  Suicide Prevention Education:  Education Completed; Brandon [name], father, [phone]  has been identified by the patient as the family member/significant other with whom the patient will be residing, and identified as the person(s) who will aid the patient in the event of a mental health crisis (suicidal ideations/suicide attempt).  With written consent from the patient, the family member/significant other has been provided the following suicide prevention education, prior to the and/or following the discharge of the patient.  The suicide prevention education provided includes the following:  Suicide risk factors  Suicide prevention and interventions  National Suicide Hotline telephone number  [facility] assessment telephone number  [facility] Emergency Assistance 911  [facility] and/or Residential Mobile Crisis Unit telephone number  Request made of family/significant other to:  Remove weapons (e.g., guns, rifles, knives), all items previously/currently identified as safety concern.    Remove drugs/medications (over-the-counter, prescriptions, illicit drugs), all items previously/currently identified as a safety concern.  The family member/significant other verbalizes understanding of the suicide prevention education information provided.  The family member/significant other agrees to remove the items of safety concern listed above.  Brandon [name] 10/15/2017, 4:47 PM

## 2017-10-15 NOTE — Progress Notes (Signed)
Patient ID: Brandon [name], male   DOB: 08/02/1976, 42 y.o.   MRN: [id number]  D: Patient has been visible on the unit. Pt c/o headache which was not relived by medication given earlier. Motrin adminstered with relive. Pt attended evening wrap up group. Denies  SI/HI/AVH.No behavioral issues noted.  A: Support and encouragement offered as needed to express needs. Medications administered as prescribed.  R: Patient is safe and cooperative on unit. Will continue to monitor  for safety and stability.

## 2017-10-15 NOTE — Progress Notes (Signed)
DAR NOTE: Patient presents with anxious affect and depressed mood.  Denies pain, auditory and visual hallucinations.  Rates depression at 7, hopelessness at 8, and anxiety at 8.  Maintained on routine safety checks.  Medications given as prescribed.  Support and encouragement offered as needed.  Attended group and participated.  States goal for today is "med management."  Patient observed socializing with peers in the dayroom.  Offered no complaint.

## 2017-10-15 NOTE — [facility] Group Notes (Signed)
LCSW Group Therapy Note  10/15/2017 1:15pm  Type of Therapy/Topic:  Group Therapy:  Balance in Life  Participation Level:  Active  Description of Group:    This group will address the concept of balance and how it feels and looks when one is unbalanced. Patients will be encouraged to process areas in their lives that are out of balance and identify reasons for remaining unbalanced. Facilitators will guide patients in utilizing problem-solving interventions to address and correct the stressor making their life unbalanced. Understanding and applying boundaries will be explored and addressed for obtaining and maintaining a balanced life. Patients will be encouraged to explore ways to assertively make their unbalanced needs known to significant others in their lives, using other group members and facilitator for support and feedback.  Therapeutic Goals: 1. Patient will identify two or more emotions or situations they have that consume much of in their lives. 2. Patient will identify signs/triggers that life has become out of balance:  3. Patient will identify two ways to set boundaries in order to achieve balance in their lives:  4. Patient will demonstrate ability to communicate their needs through discussion and/or role plays  Summary of Patient Progress:   Brandon PhillipsKori came to group and was there the entire time.  He stated that he felt unbalanced today and was worried about the future.  When he feels this way he secludes himself and fidgets.  The things he listed that could help him feel more balanced is meditation, therapy, and his faith.    Therapeutic Modalities:   Cognitive Behavioral Therapy Solution-Focused Therapy Assertiveness Training  Aram Beechamngel M Jasper Hanf, Student-Social Work 10/15/2017 1:26 PM

## 2017-10-15 NOTE — Tx Team (Signed)
Interdisciplinary Treatment and Diagnostic Plan Update  10/15/2017 Time of Session: 10:53 AM  Brandon [name] MRN: [id number]  Principal Diagnosis: Bipolar I disorder, current or most recent episode depressed, with psychotic features (HCC)  Secondary Diagnoses: Active Problems:   MDD (major depressive disorder), recurrent severe, without psychosis (HCC)   Current Medications:  Current Facility-Administered Medications  Medication Dose Route Frequency Provider Last Rate Last Dose  . acetaminophen (TYLENOL) tablet 650 mg  650 mg Oral Q6H PRN Laveda Abbe, NP   650 mg at 10/14/17 1603  . alum & mag hydroxide-simeth (MAALOX/MYLANTA) 200-200-20 MG/5ML suspension 30 mL  30 mL Oral Q4H PRN Laveda Abbe, NP      . hydrOXYzine (ATARAX/VISTARIL) tablet 25 mg  25 mg Oral TID PRN Laveda Abbe, NP   25 mg at 10/15/17 0834  . ibuprofen (ADVIL,MOTRIN) tablet 600 mg  600 mg Oral Q6H PRN Kerry Hough, PA-C   600 mg at 10/14/17 2220  . levothyroxine (SYNTHROID, LEVOTHROID) tablet 100 mcg  100 mcg Oral QAC breakfast Laveda Abbe, NP   100 mcg at 10/15/17 1191  . magnesium hydroxide (MILK OF MAGNESIA) suspension 30 mL  30 mL Oral Daily PRN Laveda Abbe, NP      . nicotine (NICODERM CQ - dosed in mg/24 hours) patch 21 mg  21 mg Transdermal Daily Micheal Likens, MD   21 mg at 10/15/17 [phone]  . ondansetron (ZOFRAN) tablet 4 mg  4 mg Oral Q8H PRN Laveda Abbe, NP      . pantoprazole (PROTONIX) EC tablet 40 mg  40 mg Oral Daily Laveda Abbe, NP   40 mg at 10/15/17 9562  . QUEtiapine (SEROQUEL) tablet 300 mg  300 mg Oral QHS Laveda Abbe, NP   300 mg at 10/14/17 2107  . traZODone (DESYREL) tablet 150 mg  150 mg Oral QHS,MR X 1 Kerry Hough, PA-C   150 mg at 10/14/17 2220    PTA Medications: Medications Prior to Admission  Medication Sig Dispense Refill Last Dose  . Armodafinil (NUVIGIL) 250 MG tablet Take 250 mg by mouth  daily.   10/13/2017 at Unknown time  . cyclobenzaprine (FLEXERIL) 10 MG tablet Take 1 tablet (10 mg total) by mouth 2 (two) times daily as needed for muscle spasms. (Patient not taking: Reported on 10/13/2017) 20 tablet 0 Completed Course at Unknown time  . levothyroxine (SYNTHROID, LEVOTHROID) 100 MCG tablet Take 100 mcg by mouth daily before breakfast.   10/12/2017 at Unknown time  . meloxicam (MOBIC) 7.5 MG tablet Take 1-2 tabs daily for 5 days, then 1 daily as needed for pain (Patient not taking: Reported on 10/13/2017) 30 tablet 0 Completed Course at Unknown time  . omeprazole (PRILOSEC) 20 MG capsule Take 20 mg by mouth daily.   10/12/2017 at Unknown time  . ondansetron (ZOFRAN) 4 MG tablet Take 1 tablet (4 mg total) by mouth every 6 (six) hours. 12 tablet 0   . QUEtiapine (SEROQUEL) 300 MG tablet Take 300 mg by mouth at bedtime.   10/12/2017 at Unknown time  . traZODone (DESYREL) 100 MG tablet Take 100 mg by mouth at bedtime.   10/12/2017 at Unknown time    Treatment Modalities: Medication Management, Group therapy, Case management,  1 to 1 session with clinician, Psychoeducation, Recreational therapy.  Patient Stressors: Medication change or noncompliance Substance abuse  Patient Strengths: Ability for insight Average or above average intelligence Capable of independent living [employer] of knowledge Motivation  for treatment/growth   Physician Treatment Plan for Primary Diagnosis: Bipolar I disorder, current or most recent episode depressed, with psychotic features (HCC) Long Term Goal(s): Improvement in symptoms so as ready for discharge  Short Term Goals:    Medication Management: Evaluate patient's response, side effects, and tolerance of medication regimen.  Therapeutic Interventions: 1 to 1 sessions, Unit Group sessions and Medication administration.  Evaluation of Outcomes: Progressing  Physician Treatment Plan for Secondary Diagnosis: Active Problems:   MDD (major  depressive disorder), recurrent severe, without psychosis (HCC)  Long Term Goal(s): Improvement in symptoms so as ready for discharge  Short Term Goals:    Medication Management: Evaluate patient's response, side effects, and tolerance of medication regimen.  Therapeutic Interventions: 1 to 1 sessions, Unit Group sessions and Medication administration.  Evaluation of Outcomes: Progressing   RN Treatment Plan for Primary Diagnosis: Bipolar I disorder, current or most recent episode depressed, with psychotic features (HCC) Long Term Goal(s): Knowledge of disease and therapeutic regimen to maintain health will improve  Short Term Goals: Ability to demonstrate self-control, Ability to participate in decision making will improve, Ability to identify and develop effective coping behaviors will improve and Compliance with prescribed medications will improve  Medication Management: RN will administer medications as ordered by provider, will assess and evaluate patient's response and provide education to patient for prescribed medication. RN will report any adverse and/or side effects to prescribing provider.  Therapeutic Interventions: 1 on 1 counseling sessions, Psychoeducation, Medication administration, Evaluate responses to treatment, Monitor vital signs and CBGs as ordered, Perform/monitor CIWA, COWS, AIMS and Fall Risk screenings as ordered, Perform wound care treatments as ordered.  Evaluation of Outcomes: Progressing   LCSW Treatment Plan for Primary Diagnosis: Bipolar I disorder, current or most recent episode depressed, with psychotic features (HCC) Long Term Goal(s): Safe transition to appropriate next level of care at discharge, Engage patient in therapeutic group addressing interpersonal concerns.  Short Term Goals: Engage patient in aftercare planning with referrals and resources, Facilitate acceptance of mental health diagnosis and concerns, Facilitate patient progression through  stages of change regarding substance use diagnoses and concerns, Identify triggers associated with mental health/substance abuse issues and Increase skills for wellness and recovery  Therapeutic Interventions: Assess for all discharge needs, 1 to 1 time with Social worker, Explore available resources and support systems, Assess for adequacy in community support network, Educate family and significant other(s) on suicide prevention, Complete Psychosocial Assessment, Interpersonal group therapy.  Evaluation of Outcomes: Progressing   Progress in Treatment: Attending groups: Yes Participating in groups: Yes Taking medication as prescribed: Yes Toleration of medication: Yes, no side effects reported at this time Family/Significant other contact made: No Patient understands diagnosis: Yes AEB asking for help with medications and substance use  Discussing patient identified problems/goals with staff: Yes Medical problems stabilized or resolved: Yes Denies suicidal/homicidal ideation: Yes Issues/concerns per patient self-inventory: None Other: N/A  New problem(s) identified: None identified at this time.   New Short Term/Long Term Goal(s): "Medication management, help with addiction, and getting on medicaid"   Discharge Plan or Barriers: Pt would like to attend a long term substance use treatment center.    Reason for Continuation of Hospitalization: Depression Hallucinations Medication stabilization    Estimated Length of Stay: 10/21/17   Attendees: Patient: Brandon [name]  10/15/2017  10:53 AM  Physician: Jolyne Loa, MD 10/15/2017  10:53 AM  Nursing: Estella Husk, RN 10/15/2017  10:53 AM  RN Care Manager: Onnie Boer, RN 10/15/2017  10:53  AM  Social Worker: Richelle Itood North, LCSW; Melba CoonAngel Genesia Caslin, Social Work Intern 10/15/2017  10:53 AM  Recreational Therapist: Caroll RancherMarjette Lindsay, LRT 10/15/2017  10:53 AM  Other: Tomasita Morrowelora Sutton, P4CC 10/15/2017  10:53 AM  Other:  10/15/2017   10:53 AM  Other: 10/15/2017  10:53 AM    Scribe for Treatment Team: Aram BeechamAngel M Eudelia Hiltunen, Student-Social Work 10/15/2017 10:53 AM

## 2017-10-15 NOTE — [facility] Suicide Risk Assessment (Signed)
[facility]   Nursing information obtained from:    Demographic factors:    Current Mental Status:    Loss Factors:    Historical Factors:    Risk Reduction Factors:      Total Time spent with patient: 1 hour Principal Problem: Bipolar I disorder, current or most recent episode depressed, with psychotic features [facility](HCC) Diagnosis:   Patient Active Problem List   Diagnosis Date Noted  . PTSD (post-traumatic stress disorder) [F43.10] 10/15/2017  . Bipolar I disorder, current or most recent episode depressed, with psychotic features (HCC) [F31.5] 10/14/2017   Subjective Data: see H&P for details  Continued Clinical Symptoms:  Alcohol Use Disorder Identification Test Final Score (AUDIT): 35 The "Alcohol Use Disorders Identification Test", Guidelines for Use in Primary Care, Second Edition.  World Science writerHealth Organization [facility](WHO). Score between 0-7:  no or low risk or alcohol related problems. Score between 8-15:  moderate risk of alcohol related problems. Score between 16-19:  high risk of alcohol related problems. Score 20 or above:  warrants further diagnostic evaluation for alcohol dependence and treatment.   CLINICAL FACTORS:   Severe Anxiety and/or Agitation Bipolar Disorder:   Mixed State Alcohol/Substance Abuse/Dependencies More than one psychiatric diagnosis Currently Psychotic Previous Psychiatric Diagnoses and Treatments Medical Diagnoses and Treatments/Surgeries   Psychiatric Specialty Exam: Physical Exam  ROS  Blood pressure 106/80, pulse 90, temperature 97.6 F (36.4 C), temperature source Oral, resp. rate 18, height 6\' 4"  (1.93 m), weight 108.9 kg (240 lb).Body mass index is 29.21 kg/m.       COGNITIVE FEATURES THAT CONTRIBUTE TO RISK:  None    SUICIDE RISK:   Moderate:  Frequent suicidal ideation with limited intensity, and duration, some specificity in terms of plans, no associated intent, good self-control, limited  dysphoria/symptomatology, some risk factors present, and identifiable protective factors, including available and accessible social support.  PLAN OF CARE: See H&P for details  I certify that inpatient services furnished can reasonably be expected to improve the patient's condition.   Micheal Likenshristopher T Elmarie Devlin, MD 10/15/2017, 4:40 PM

## 2017-10-15 NOTE — [facility] Group Notes (Signed)
[facility] Group Notes:  (Nursing/MHT/Case Management/Adjunct)  Date:  10/15/2017  Time:  6:32 PM  Type of Therapy:  Psychoeducational Skills  Participation Level:  Did Not Attend  Participation Quality:  DID NOT ATTEND  Affect:  DID NOT ATTEND  Cognitive:  DID NOT ATTEND  Insight:  None  Engagement in Group:  DID NOT ATTEND  Modes of Intervention:  DID NOT ATTEND  Summary of Progress/Problems: Pt did not attend patient self inventory group.   Bethann PunchesJane O Evamarie Raetz 10/15/2017, 6:32 PM

## 2017-10-15 NOTE — Progress Notes (Signed)
Recreation Therapy Notes  Date: 10/15/17 Time: 1000 Location: 500 Hall Dayroom  Group Topic: Anger Management  Goal Area(s) Addresses:  Patient will identify triggers for anger.  Patient will identify physical reaction to anger.   Patient will identify benefit of using coping skills when angry.  Behavioral Response: Engaged  Intervention: Worksheet, pencils  Activity: Introduction to Stress Management.  Patients were to identify the situations/topics/people that lead to anger; how they act when they are angry and problems they have run into because of their anger.    Education: Anger Management, Discharge Planning   Education Outcome: Acknowledges education/In group clarification offered/Needs additional education.   Clinical Observations/Feedback:  Pt arrived late but wrote his triggers are people with bad attitudes/people with no respect and ignorance.  Pt wrote when he gets angry he goes into a rage.  Pt wrote he hasn't really had any problems because of his anger.  Pt left early to meet with doctor and did not return.     Caroll RancherMarjette Jami Bogdanski, LRT/CTRS      Caroll RancherLindsay, Temperence Zenor A 10/15/2017 12:33 PM

## 2017-10-15 NOTE — Progress Notes (Signed)
Nursing Progress Note: 7p-7a D: Pt currently presents with a pleasant/appropriate/calm/cooperative affect and behavior. Pt states "I had a good day today. We went and played basketball and that was really good for me. I need to exercise more. I feel like that would really help clear my head." Interacting appropriately with the milieu. Pt reports fair sleep during the previous night with current medication regimen. Pt did attend wrap-up group.  A: Pt provided with medications per providers orders. Pt's labs and vitals were monitored throughout the night. Pt supported emotionally and encouraged to express concerns and questions. Pt educated on medications.  R: Pt's safety ensured with 15 minute and environmental checks. Pt currently denies SI, HI, and VH and endorses intermittent AH. Pt verbally contracts to seek staff if SI,HI, or AVH occurs and to consult with staff before acting on any harmful thoughts. Will continue to monitor.
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
## 2017-10-16 MED ORDER — PRAZOSIN HCL 2 MG PO CAPS
2.0000 mg | ORAL_CAPSULE | Freq: Every day | ORAL | Status: DC
  Administered 2017-10-16: 2 mg via ORAL
  Filled 2017-10-16 (×2): qty 1

## 2017-10-16 NOTE — Progress Notes (Signed)
Adult Psychoeducational Group Note  Date:  10/16/2017 Time:  8:58 PM  Group Topic/Focus:  Wrap-Up Group:   The focus of this group is to help patients review their daily goal of treatment and discuss progress on daily workbooks.  Participation Level:  Active  Participation Quality:  Appropriate  Affect:  Appropriate  Cognitive:  Appropriate  Insight: Appropriate  Engagement in Group:  Engaged  Modes of Intervention:  Discussion  Additional Comments:  The patient expressed that he attend all groupsThe patent also said that he rates today a 7.  Octavio Mannshigpen, Darick Fetters Lee 10/16/2017, 8:58 PM

## 2017-10-16 NOTE — Progress Notes (Signed)
Recreation Therapy Notes  Date: 10/16/17 Time: 1000 Location: 500 Hall Dayroom  Group Topic: Goal Setting  Goal Area(s) Addresses:  Patient will be able to identify at least 3 life goals.  Patient will be able to identify benefit of investing in life goals.  Patient will be able to identify benefit of setting life goals.   Intervention: Worksheet, pencils  Activity: Goal Planning.  Patients were to set goals they wanted to accomplish in a week, in the next month, in the next year and five years from now.  Patients were to then identify obstacles to reaching goals, what they need to do achieve their goals and what they can start doing tomorrow to work towards their goals.  Education:  Discharge Planning, Coping Skills   Education Outcome: Acknowledges Education/In Group Clarification Provided/Needs Additional Education  Clinical Observations:  Pt did not attend group.   Caroll RancherMarjette Lord [place], LRT/CTRS         Caroll RancherLindsay, Marnesha Gagen A 10/16/2017 12:30 PM

## 2017-10-16 NOTE — Progress Notes (Addendum)
Nursing Progress Note: 7p-7a D: Pt currently presents with a pleasant/anxious/cooperative affect and behavior. Interacting appropriately with the milieu. Pt reports good sleep during the previous night with current medication regimen. Pt did attend wrap-up group.  A: Pt provided with medications per providers orders. Pt's labs and vitals were monitored throughout the night. Pt supported emotionally and encouraged to express concerns and questions. Pt educated on medications.  R: Pt's safety ensured with 15 minute and environmental checks. Pt currently denies SI, HI, and AVH. Pt verbally contracts to seek staff if SI,HI, or AVH occurs and to consult with staff before acting on any harmful thoughts. Will continue to monitor.

## 2017-10-16 NOTE — Progress Notes (Signed)
[facility]  10/16/2017 12:34 PM Brandon ChromanKori [name]  MRN:  [id number] Subjective:    Brandon [name] is a 42 y/o M with history of treatment for depression, ADHD, anxiety, insomnia, and substance abuse of stimulants and benzodiazepines who was admitted on IVC with worsening symptoms of depression and psychosis in the context of worsening substance use of alcohol, crack cocaine, benzodiazepines, and recent suicide attempt via overdose of Nuvigil. Pt was changed from home medication of seroquel to zyprexa to address depression and psychosis symptoms. He was also restarted on previous medication of cymbalta for depression symptoms, and he was started on prazosin for addressing symptoms of nightmares. He has been working with SW team about arranging for referral to substance use treatment after discharge.  Today upon evaluation, pt shares, "I'm doing okay today - just tired this morning. " Pt reports that overall his mood has improved during his stay and he has not experienced previous psychotic symptoms of hearing AH or messages from TV/radio. He denies SI/HI/AH/VH. He reports that he was able to get to sleep last night but his sleep was disturbed by nightmares. His appetite is good. He has no other physical complaints. He agrees to increase dose of prazosin tonight and continue his other medications without changes.  Principal Problem: Bipolar I disorder, current or most recent episode depressed, with psychotic features [facility](HCC) Diagnosis:   Patient Active Problem List   Diagnosis Date Noted  . PTSD (post-traumatic stress disorder) [F43.10] 10/15/2017  . Bipolar I disorder, current or most recent episode depressed, with psychotic features (HCC) [F31.5] 10/14/2017   Total Time spent with patient: 30 minutes  Past Psychiatric History: see H&P  Past Medical History:  Past Medical History:  Diagnosis Date  . Anxiety   . Depression   . Hyperlipidemia   . Substance abuse (HCC)   . Thyroid  disease    History reviewed. No pertinent surgical history. Family History:  Family History  Problem Relation Age of Onset  . Diabetes Father    Family Psychiatric  History: see H&P Social History:  Social History   Substance and Sexual Activity  Alcohol Use Yes     Social History   Substance and Sexual Activity  Drug Use Yes  . Types: "Crack" cocaine    Social History   Socioeconomic History  . Marital status: Single    Spouse name: None  . Number of children: None  . Years of education: None  . Highest education level: None  Social Needs  . Financial resource strain: None  . Food insecurity - worry: None  . Food insecurity - inability: None  . Transportation needs - medical: None  . Transportation needs - non-medical: None  Occupational History  . None  Tobacco Use  . Smoking status: Current Every Day Smoker    Types: Cigarettes  . Smokeless tobacco: Never Used  Substance and Sexual Activity  . Alcohol use: Yes  . Drug use: Yes    Types: "Crack" cocaine  . Sexual activity: No  Other Topics Concern  . None  Social History Narrative  . None   Additional Social History:                         Sleep: Fair  Appetite:  Good  Current Medications: Current Facility-Administered Medications  Medication Dose Route Frequency Provider Last Rate Last Dose  . acetaminophen (TYLENOL) tablet 650 mg  650 mg Oral Q6H PRN Laveda AbbeParks, Laurie Britton, NP  650 mg at 10/14/17 1603  . alum & mag hydroxide-simeth (MAALOX/MYLANTA) 200-200-20 MG/5ML suspension 30 mL  30 mL Oral Q4H PRN Laveda Abbe, NP      . DULoxetine (CYMBALTA) DR capsule 30 mg  30 mg Oral Daily Micheal Likens, MD   30 mg at 10/16/17 0811  . hydrOXYzine (ATARAX/VISTARIL) tablet 50 mg  50 mg Oral Q6H PRN Micheal Likens, MD   50 mg at 10/15/17 1835  . ibuprofen (ADVIL,MOTRIN) tablet 600 mg  600 mg Oral Q6H PRN Kerry Hough, PA-C   600 mg at 10/14/17 2220  . levothyroxine  (SYNTHROID, LEVOTHROID) tablet 100 mcg  100 mcg Oral QAC breakfast Laveda Abbe, NP   100 mcg at 10/16/17 [phone]  . magnesium hydroxide (MILK OF MAGNESIA) suspension 30 mL  30 mL Oral Daily PRN Laveda Abbe, NP   30 mL at 10/15/17 1150  . nicotine (NICODERM CQ - dosed in mg/24 hours) patch 21 mg  21 mg Transdermal Daily Micheal Likens, MD   21 mg at 10/16/17 5284  . OLANZapine (ZYPREXA) tablet 10 mg  10 mg Oral QHS Micheal Likens, MD   10 mg at 10/15/17 2103  . OLANZapine zydis (ZYPREXA) disintegrating tablet 5 mg  5 mg Oral Q8H PRN Micheal Likens, MD   5 mg at 10/15/17 1304  . ondansetron (ZOFRAN) tablet 4 mg  4 mg Oral Q8H PRN Laveda Abbe, NP      . pantoprazole (PROTONIX) EC tablet 40 mg  40 mg Oral Daily Laveda Abbe, NP   40 mg at 10/16/17 1324  . prazosin (MINIPRESS) capsule 1 mg  1 mg Oral QHS Micheal Likens, MD   1 mg at 10/15/17 2103  . traZODone (DESYREL) tablet 150 mg  150 mg Oral QHS,MR X 1 Kerry Hough, PA-C   150 mg at 10/15/17 2242    Lab Results: No results found for this or any previous visit (from the past 48 hour(s)).  Blood Alcohol level:  Lab Results  Component Value Date   ETH <10 10/13/2017   ETH 70 (H) 06/18/2012    Metabolic Disorder Labs: No results found for: HGBA1C, MPG No results found for: PROLACTIN No results found for: CHOL, TRIG, HDL, CHOLHDL, VLDL, LDLCALC  Physical Findings: AIMS: Facial and Oral Movements Muscles of Facial Expression: None, normal Lips and Perioral Area: None, normal Jaw: None, normal Tongue: None, normal,Extremity Movements Upper (arms, wrists, hands, fingers): None, normal Lower (legs, knees, ankles, toes): None, normal, Trunk Movements Neck, shoulders, hips: None, normal, Overall Severity Severity of abnormal movements (highest score from questions above): None, normal Incapacitation due to abnormal movements: None, normal Patient's awareness of  abnormal movements (rate only patient's report): No Awareness, Dental Status Current problems with teeth and/or dentures?: No Does patient usually wear dentures?: No  CIWA:    COWS:     Musculoskeletal: Strength & Muscle Tone: within normal limits Gait & Station: normal Patient leans: N/A  Psychiatric Specialty Exam: Physical Exam  Nursing note and vitals reviewed.   Review of Systems  Constitutional: Negative for chills and fever.  Respiratory: Negative for cough and shortness of breath.   Cardiovascular: Negative for chest pain.  Gastrointestinal: Negative for abdominal pain, heartburn, nausea and vomiting.  Psychiatric/Behavioral: Negative for depression, hallucinations and suicidal ideas. The patient is not nervous/anxious.     Blood pressure 124/74, pulse (!) 107, temperature 97.6 F (36.4 C), temperature source Oral, resp. rate 20,  height 6\' 4"  (1.93 m), weight 108.9 kg (240 lb).Body mass index is 29.21 kg/m.  General Appearance: Casual and Fairly Groomed  Eye Contact:  Good  Speech:  Clear and Coherent and Normal Rate  Volume:  Normal  Mood:  Euthymic  Affect:  Appropriate, Congruent and Constricted  Thought Process:  Coherent and Goal Directed  Orientation:  Full (Time, Place, and Person)  Thought Content:  Logical  Suicidal Thoughts:  No  Homicidal Thoughts:  No  Memory:  Immediate;   Fair Recent;   Fair Remote;   Fair  Judgement:  Fair  Insight:  Fair  Psychomotor Activity:  Normal  Concentration:  Concentration: Good  Recall:  Good  Fund of Knowledge:  Good  Language:  Good  Akathisia:  No  Handed:    AIMS (if indicated):     Assets:  Communication Skills Desire for Improvement Financial Resources/Insurance Physical Health Resilience Social Support  ADL's:  Intact  Cognition:  WNL  Sleep:  Number of Hours: 5.25    Treatment Plan Summary: Daily contact with patient to assess and evaluate symptoms and progress in treatment and Medication  management   -Bipolar I, current episode depressed with psychotic features   - Continue zyprexa 10mg  po qhs   - Continue cymbalta 30mg  po qDay  -PTSD   - Change prazosin 1mg  po qhs to prazosin 2mg  po qhs  - Anxiety   - Continue atarax 50mg  po q6h prn anxiety  -Agitation/psychosis   - Continue zydis 5mg  po q8h prn psychosis/agitation  - Insomnia   - Continue trazodone 150mg  po qhs and may repeat x1 PRN  -GERD   -Continue protonix 40mg  po qDay  -Hypothyroidism   - Continue synthroid qDay  - Encourage participation in groups and therapeutic milieu  -Discharge planning will be ongoing  Micheal Likens, MD 10/16/2017, 12:34 PM

## 2017-10-16 NOTE — Plan of Care (Signed)
Patient is safe and free from injury.  Routine safety checks maintained every 15 minutes. 

## 2017-10-16 NOTE — Progress Notes (Signed)
Adult Psychoeducational Group Note  Date:  10/16/2017 Time:  4:38 PM  Group Topic/Focus:  Crisis Planning:   The purpose of this group is to help patients create a crisis plan for use upon discharge or in the future, as needed.  Participation Level:  Did Not Attend    Brandon [name] O [name] 10/16/2017, 4:38 PM 

## 2017-10-16 NOTE — Progress Notes (Signed)
DAR NOTE: Patient presents with anxious affect and pleasant mood.  Denies suicidal thoughts, pain, auditory and visual hallucinations.  Described energy level as normal and concentration as good.  Rates depression at 0, hopelessness at 0, and anxiety at 0.  Maintained on routine safety checks.  Medications given as prescribed.  Support and encouragement offered as needed.  Attended group and participated.  Patient observed socializing with peers in the dayroom.  Vistaril 50 mg given for complain of anxiety with good effect.

## 2017-10-16 NOTE — B[facility] Group Notes (Signed)
LCSW Group Therapy Note   10/16/2017 1:15pm   Type of Therapy and Topic:  Group Therapy:  Trust and Honesty  Participation Level:  Active  Description of Group:    In this group patients will be asked to explore the value of being honest.  Patients will be guided to discuss their thoughts, feelings, and behaviors related to honesty and trusting in others. Patients will process together how trust and honesty relate to forming relationships with peers, family members, and [name]. Each patient will be challenged to identify and express feelings of being vulnerable. Patients will discuss reasons why people are dishonest and identify alternative outcomes if one was truthful (to [name] or others). This group will be process-oriented, with patients participating in exploration of their own experiences, giving and receiving support, and processing challenge from other group members.   Therapeutic Goals: 1. Patient will identify why honesty is important to relationships and how honesty overall affects relationships.  2. Patient will identify a situation where they lied or were lied too and the  feelings, thought process, and behaviors surrounding the situation 3. Patient will identify the meaning of being vulnerable, how that feels, and how that correlates to being honest with [name] and others. 4. Patient will identify situations where they could have told the truth, but instead lied and explain reasons of dishonesty.   Summary of Patient Progress  "I think by being here we are being honest with yourself.  You are asking for help, and you are talking about feelings rather than just looking for a way to avoid them and be numb." Went on to talk about the difference between [name] and his "Brandon [name]."  Therapeutic Modalities:   Cognitive Behavioral Therapy Solution Focused Therapy Motivational Interviewing Brief Therapy  Brandon RogueRodney B Armon Orvis, LCSW 10/16/2017 10:19 AM

## 2017-10-16 NOTE — Progress Notes (Signed)
Recreation Therapy Notes  INPATIENT RECREATION THERAPY ASSESSMENT  Patient Details Name: Candie ChromanKori Litzenberger MRN: [id number] DOB: 1975/11/28 Today's Date: 10/16/2017       Information Obtained From: Patient  Able to Participate in Assessment/Interview: Yes  Patient Presentation: Oriented, Groomed  Reason for Admission (Per Patient): Suicidal Ideation, Other (Comments)(Overdose, paranoia)  Patient Stressors: Other (Comment)(Addiction, homeless)  Coping Skills:   Isolation, Self-Injury, TV, Music, Exercise, Substance Abuse, Impulsivity, Talk, Prayer, Avoidance  Leisure Interests (2+):  Individual - TV, Individual - Other (Comment)(Cook)  Frequency of Recreation/Participation: Weekly  Awareness of Community Resources:  Yes  Community Resources:  Library, [employer], Public affairs consultantestaurants  Current Use: Yes  Expressed Interest in State Street CorporationCommunity Resource Information: No  En[place] EnergyCounty of Residence[place]  Patient Main Form of Transportation: Set designerCar  Patient Strengths:  Geophysical data processorerserverance, Care about others  Patient Identified Areas of Improvement:  Staying clean, Self-esteem  Patient Goal for Hospitalization:  "Find a good after plan"  Current SI (including self-harm):  No  Current HI:  No  Current AVH: No  Staff Intervention Plan: Group Attendance  Consent to Intern Participation: N/A    Caroll RancherMarjette Donyell Carrell, LRT/CTRS  Lillia AbedLindsay, Harlee Eckroth A 10/16/2017, 1:23 PM
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
## 2017-10-17 MED ORDER — PRAZOSIN HCL 1 MG PO CAPS
3.0000 mg | ORAL_CAPSULE | Freq: Every day | ORAL | Status: DC
  Administered 2017-10-17 – 2017-10-20 (×4): 3 mg via ORAL
  Filled 2017-10-17 (×6): qty 1

## 2017-10-17 NOTE — Progress Notes (Signed)
Adult Psychoeducational Group Note  Date:  10/17/2017 Time:  8:32 PM  Group Topic/Focus:  Wrap-Up Group:   The focus of this group is to help patients review their daily goal of treatment and discuss progress on daily workbooks.  Participation Level:  Minimal  Participation Quality:  Appropriate  Affect:  Flat  Cognitive:  Oriented  Insight: Appropriate  Engagement in Group:  Engaged  Modes of Intervention:  Socialization and Support  Additional Comments:  Patient attended and participated in group tonight. He reports that he had a hard day today. He has been feeling irritable.  Today he went to group, he line up a place to go whenever he is discharged. He had a visitor from a friend today.  Lita MainsFrancis, Jamylah Marinaccio [facility] 10/17/2017, 8:32 PM

## 2017-10-17 NOTE — Progress Notes (Signed)
D: Pt denies SI/HI/AVH. Pt is pleasant and cooperative. Pt stated he was doing better.   A: Pt was offered support and encouragement. Pt was given scheduled medications. Pt was encourage to attend groups. Q 15 minute checks were done for safety.   R:Pt attends groups and interacts well with peers and staff. Pt is taking medication. Pt has no complaints at this time .Pt receptive to treatment and safety maintained on unit.

## 2017-10-17 NOTE — Progress Notes (Signed)
Recreation Therapy Notes  Date: 10/17/17 Time: 1000 Location: 500 Hall Dayroom  Group Topic: Communication, Team Building, Problem Solving  Goal Area(s) Addresses:  Patient will effectively work with peer towards shared goal.  Patient will identify skill used to make activity successful.  Patient will identify how skills used during activity can be used to reach post d/c goals.   Intervention: STEM Activity   Activity: Berks[employer] HathawayPipe Cleaner Tower. In teams, patients were asked to build the tallest freestanding tower possible out of 15 pipe cleaners. Systematically resources were removed, for example patient ability to use both hands and patient ability to verbally communicate.    Education: Pharmacist, communityocial Skills, Building control surveyorDischarge Planning.   Education Outcome: Acknowledges education/In group clarification offered/Needs additional education.   Clinical Observations/Feedback: Pt did not attend group.    Caroll RancherMarjette Gregary Blackard, LRT/CTRS         Lillia AbedLindsay, Letonya Mangels A 10/17/2017 11:07 AM

## 2017-10-17 NOTE — Plan of Care (Signed)
  Safety: Periods of time without injury will increase 10/17/2017 2137 - Progressing by Delos HaringPhillips, Nicolle Heward A, RN Note Pt safe on the unit

## 2017-10-17 NOTE — Progress Notes (Signed)
[facility] MD Progress Note  10/17/2017 11:31 AM Brandon [name]  MRN:  [id number] Subjective:    Brandon [name] is a 42 y/o M with history of treatment for depression, ADHD, anxiety, insomnia, and substance abuse of stimulants and benzodiazepines who was admitted on IVC with worsening symptoms of depression and psychosis in the context of worsening substance use of alcohol, crack cocaine, benzodiazepines, and recent suicide attempt via overdose of Nuvigil. Pt was changed from home medication of seroquel to zyprexa to address depression and psychosis symptoms. He was also restarted on previous medication of cymbalta for depression symptoms, and he was started on prazosin for addressing symptoms of nightmares, and dose was titrated up during his stay. He has been working with SW team about arranging for referral to substance use treatment after discharge.  Today upon evaluation, pt shares, "It's going okay; I'm just a little irritable this morning." Pt reports that overall he is doing well and feels that his mood and anxiety symptoms are improving significantly during his stay. He denies SI/HI/AH/VH. He shares that increased dose of prazosin has helped with nightmare intensity, stating, "The nightmares are still there but they were better last night." Pt denies any physical complaints. He is tolerating his medications well without side effects or difficulty. He is in agreement to continue on his current regimen with only change being to increase dose of prazosin tonight. Pt has identified [facility] as his preference for substance use treatment, and he will work with SW team in regards to taking next step for referral.  Principal Problem: Bipolar I disorder, current or most recent episode depressed, with psychotic features (HCC) Diagnosis:   Patient Active Problem List   Diagnosis Date Noted  . PTSD (post-traumatic stress disorder) [F43.10] 10/15/2017  . Bipolar I disorder, current or most  recent episode depressed, with psychotic features (HCC) [F31.5] 10/14/2017   Total Time spent with patient: 30 minutes  Past Psychiatric History: see H&P  Past Medical History:  Past Medical History:  Diagnosis Date  . Anxiety   . Depression   . Hyperlipidemia   . Substance abuse (HCC)   . Thyroid disease    History reviewed. No pertinent surgical history. Family History:  Family History  Problem Relation Age of Onset  . Diabetes Father    Family Psychiatric  History: see H&P Social History:  Social History   Substance and Sexual Activity  Alcohol Use Yes     Social History   Substance and Sexual Activity  Drug Use Yes  . Types: "Crack" cocaine    Social History   Socioeconomic History  . Marital status: Single    Spouse name: None  . Number of children: None  . Years of education: None  . Highest education level: None  Social Needs  . Financial resource strain: None  . Food insecurity - worry: None  . Food insecurity - inability: None  . Transportation needs - medical: None  . Transportation needs - non-medical: None  Occupational History  . None  Tobacco Use  . Smoking status: Current Every Day Smoker    Types: Cigarettes  . Smokeless tobacco: Never Used  Substance and Sexual Activity  . Alcohol use: Yes  . Drug use: Yes    Types: "Crack" cocaine  . Sexual activity: No  Other Topics Concern  . None  Social History Narrative  . None   Additional Social History:  Sleep: Fair  Appetite:  Good  Current Medications: Current Facility-Administered Medications  Medication Dose Route Frequency Provider Last Rate Last Dose  . acetaminophen (TYLENOL) tablet 650 mg  650 mg Oral Q6H PRN Laveda Abbe, NP   650 mg at 10/14/17 1603  . alum & mag hydroxide-simeth (MAALOX/MYLANTA) 200-200-20 MG/5ML suspension 30 mL  30 mL Oral Q4H PRN Laveda Abbe, NP      . DULoxetine (CYMBALTA) DR capsule 30 mg  30 mg Oral  Daily Micheal Likens, MD   30 mg at 10/17/17 0716  . hydrOXYzine (ATARAX/VISTARIL) tablet 50 mg  50 mg Oral Q6H PRN Micheal Likens, MD   50 mg at 10/16/17 1256  . ibuprofen (ADVIL,MOTRIN) tablet 600 mg  600 mg Oral Q6H PRN Kerry Hough, PA-C   600 mg at 10/14/17 2220  . levothyroxine (SYNTHROID, LEVOTHROID) tablet 100 mcg  100 mcg Oral QAC breakfast Laveda Abbe, NP   100 mcg at 10/17/17 0701  . magnesium hydroxide (MILK OF MAGNESIA) suspension 30 mL  30 mL Oral Daily PRN Laveda Abbe, NP   30 mL at 10/15/17 1150  . nicotine (NICODERM CQ - dosed in mg/24 hours) patch 21 mg  21 mg Transdermal Daily Micheal Likens, MD   21 mg at 10/17/17 0717  . OLANZapine (ZYPREXA) tablet 10 mg  10 mg Oral QHS Micheal Likens, MD   10 mg at 10/16/17 2117  . OLANZapine zydis (ZYPREXA) disintegrating tablet 5 mg  5 mg Oral Q8H PRN Micheal Likens, MD   5 mg at 10/15/17 1304  . ondansetron (ZOFRAN) tablet 4 mg  4 mg Oral Q8H PRN Laveda Abbe, NP      . pantoprazole (PROTONIX) EC tablet 40 mg  40 mg Oral Daily Laveda Abbe, NP   40 mg at 10/17/17 0716  . prazosin (MINIPRESS) capsule 3 mg  3 mg Oral QHS Micheal Likens, MD      . traZODone (DESYREL) tablet 150 mg  150 mg Oral QHS,MR X 1 Kerry Hough, PA-C   150 mg at 10/16/17 2318    Lab Results: No results found for this or any previous visit (from the past 48 hour(s)).  Blood Alcohol level:  Lab Results  Component Value Date   ETH <10 10/13/2017   ETH 70 (H) 06/18/2012    Metabolic Disorder Labs: No results found for: HGBA1C, MPG No results found for: PROLACTIN No results found for: CHOL, TRIG, HDL, CHOLHDL, VLDL, LDLCALC  Physical Findings: AIMS: Facial and Oral Movements Muscles of Facial Expression: None, normal Lips and Perioral Area: None, normal Jaw: None, normal Tongue: None, normal,Extremity Movements Upper (arms, wrists, hands, fingers): None,  normal Lower (legs, knees, ankles, toes): None, normal, Trunk Movements Neck, shoulders, hips: None, normal, Overall Severity Severity of abnormal movements (highest score from questions above): None, normal Incapacitation due to abnormal movements: None, normal Patient's awareness of abnormal movements (rate only patient's report): No Awareness, Dental Status Current problems with teeth and/or dentures?: No Does patient usually wear dentures?: No  CIWA:    COWS:     Musculoskeletal: Strength & Muscle Tone: within normal limits Gait & Station: normal Patient leans: N/A  Psychiatric Specialty Exam: Physical Exam  Nursing note and vitals reviewed.   Review of Systems  Constitutional: Negative for chills and fever.  Respiratory: Negative for cough and shortness of breath.   Cardiovascular: Negative for chest pain.  Gastrointestinal: Negative for abdominal pain, heartburn,  nausea and vomiting.  Psychiatric/Behavioral: Negative for depression, hallucinations and suicidal ideas. The patient is nervous/anxious.     Blood pressure 123/69, pulse (!) 108, temperature 98.7 F (37.1 C), temperature source Oral, resp. rate 20, height 6\' 4"  (1.93 m), w eight 108.9 kg (240 lb).Body mass index is 29.21 kg/m.  General Appearance: Casual and Well Groomed  Eye Contact:  Good  Speech:  Clear and Coherent and Normal Rate  Volume:  Normal  Mood:  Irritable  Affect:  Constricted  Thought Process:  Coherent and Goal Directed  Orientation:  Full (Time, Place, and Person)  Thought Content:  Logical  Suicidal Thoughts:  No  Homicidal Thoughts:  No  Memory:  Immediate;   Fair Recent;   Fair Remote;   Fair  Judgement:  Fair  Insight:  Fair  Psychomotor Activity:  Normal  Concentration:  Concentration: Fair  Recall:  FiservFair  Fund of Knowledge:  Fair  Language:  Fair  Akathisia:  No  Handed:    AIMS (if indicated):     Assets:  Communication Skills Desire for Improvement Financial  Resources/Insurance Physical Health Resilience Social Support  ADL's:  Intact  Cognition:  WNL  Sleep:  Number of Hours: 6.75    Treatment Plan Summary: Daily contact with patient to assess and evaluate symptoms and progress in treatment and Medication management  -Bipolar I, current episode depressed with psychotic features             - Continue zyprexa 10mg  po qhs             - Continue cymbalta 30mg  po qDay  -PTSD             - Change prazosin 2mg  po qhs to prazosin 3mg  po qhs  - Anxiety             - Continue atarax 50mg  po q6h prn anxiety  -Agitation/psychosis             - Continue zydis 5mg  po q8h prn psychosis/agitation  - Insomnia             - Continue trazodone 150mg  po qhs and may repeat x1 PRN  -GERD              -Continue protonix 40mg  po qDay  -Hypothyroidism             - Continue synthroid 100mcg qDay  - Encourage participation in groups and therapeutic milieu  -Discharge planning will be ongoing    Micheal Likenshristopher T Jomes Giraldo, MD 10/17/2017, 11:31 AM

## 2017-10-17 NOTE — [facility] Group Notes (Signed)
[facility] LCSW Group Therapy 10/17/2017 1:15pm  Type of Therapy: Group Therapy- Feelings Around Discharge & Establishing a Supportive Framework  Participation Level:  Active  Description of Group:   What is a supportive framework? What does it look like feel like and how do I discern it from and unhealthy non-supportive network? Learn how to cope when supports are not helpful and don't support you. Discuss what to do when your family/friends are not supportive.  Summary of Patient Progress  Brandon PhillipsKori came to group and remained there the entire time.  He spoke about feeling nervous that he does not know when he will get to leave and where he will go.  He shows no signs of psychosis.  He participated in the meditation exercise at the end on the group.    Therapeutic Modalities:   Cognitive Behavioral Therapy Person-Centered Therapy Motivational Interviewing   Brandon [name], Student-Social Work 10/17/2017 1:39 PM

## 2017-10-17 NOTE — Progress Notes (Signed)
DAR NOTE: Patient presents with anxious affect and mood is appropriate to situation.  Remained in his room most of this shift with minimal interaction with staff and peers.  Reports cravings, agitation and irritability on self inventory form.  Denies pain, auditory and visual hallucinations.  Described energy level as low and concentration as poor.  Rates depression at 7, hopelessness at 7, and anxiety at 6.  Maintained on routine safety checks.  Medications given as prescribed.  Support and encouragement offered as needed.  Routine safety checks maintained every 15 minutes.  States goal for today is "aftercare."

## 2017-10-18 DIAGNOSIS — R450 Nervousness: Secondary | ICD-10-CM

## 2017-10-18 DIAGNOSIS — I10 Essential (primary) hypertension: Secondary | ICD-10-CM

## 2017-10-18 DIAGNOSIS — K219 Gastro-esophageal reflux disease without esophagitis: Secondary | ICD-10-CM

## 2017-10-18 DIAGNOSIS — E039 Hypothyroidism, unspecified: Secondary | ICD-10-CM

## 2017-10-18 DIAGNOSIS — F1490 Cocaine use, unspecified, uncomplicated: Secondary | ICD-10-CM

## 2017-10-18 MED ORDER — IBUPROFEN 600 MG PO TABS: 600.0000 mg | ORAL_TABLET | Freq: Four times a day (QID) | ORAL | Status: DC | PRN

## 2017-10-18 NOTE — [facility] Group Notes (Signed)
[facility] Group Notes:  (Nursing/MHT/Case Management/Adjunct)  Date:  10/18/2017  Time:  9:05 AM  Type of Therapy:  Orientation/Goals group  Participation Level:  Did Not Attend  Participation Quality:  Did Not Attend  Affect:  Did Not Attend  Cognitive:  Did Not Attend  Insight:  None  Engagement in Group:  Did Not Attend  Modes of Intervention:  Did Not Attend  Summary of Progress/Problems: Pt did not attend patient self inventory group/orientation group.   Jacquelyne BalintForrest, Ashutosh Dieguez Shanta 10/18/2017, 9:05 AM

## 2017-10-18 NOTE — Plan of Care (Signed)
  Safety: Periods of time without injury will increase 10/18/2017 2227 - Progressing by Delos HaringPhillips, Lelon Ikard A, RN Note Pt safe on the unit

## 2017-10-18 NOTE — Progress Notes (Signed)
[facility]  10/18/2017 3:34 PM Brandon ChromanKori [name]  MRN:  [id number]  Subjective: Brandon [name] reports, "I'm a little tired this morning. I did not sleep well last night. I was able to fall asleep, but, could not stay asleep. I do go to some groups. I have not gone to this mornings group seesion, I don't feel like it today".  Objective: Brandon ChromanKori Wetherell is a 42 y/o M with history of treatment for depression, ADHD, anxiety, insomnia, and substance abuse of stimulants and benzodiazepines who was admitted on IVC with worsening symptoms of depression and psychosis in the context of worsening substance use of alcohol, crack cocaine, benzodiazepines, and recent suicide attempt via overdose of Nuvigil. Pt was changed from home medication of seroquel to zyprexa to address depression and psychosis symptoms. He was also restarted on previous medication of cymbalta for depression symptoms, and he was started on prazosin for addressing symptoms of nightmares, and dose was titrated up during his stay. He has been working with SW team about arranging for referral to substance use treatment after discharge.  Today upon evaluation, pt shares, "It's going okay; I'm just tired this morning because he could not stay asleep last night." Pt reports that overall he is doing well and feels that his mood and anxiety symptoms are improving significantly during his stay. He denies SI/HI/AH/VH.  Pt denies any physical complaints. He is tolerating his medications well without side effects or difficulty. He is in agreement to continue on his current regimen with only change being to increase dose of prazosin last night. [facility], and he will work with SW team in regards to taking next step for referral. He is encouraged to attend group sessions for some coping skills.  Principal Problem: Bipolar I disorder, current or most recent episode depressed, with  psychotic features [facility](HCC) Diagnosis:   Patient Active Problem List   Diagnosis Date Noted  . PTSD (post-traumatic stress disorder) [F43.10] 10/15/2017  . Bipolar I disorder, current or most recent episode depressed, with psychotic features (HCC) [F31.5] 10/14/2017   Total Time spent with patient: 15 minutes  Past Psychiatric History: See H&P  Past Medical History:  Past Medical History:  Diagnosis Date  . Anxiety   . Depression   . Hyperlipidemia   . Substance abuse (HCC)   . Thyroid disease    History reviewed. No pertinent surgical history.  Family History:  Family History  Problem Relation Age of Onset  . Diabetes Father    Family Psychiatric  History: See H&P  Social History:  Social History   Substance and Sexual Activity  Alcohol Use Yes     Social History   Substance and Sexual Activity  Drug Use Yes  . Types: "Crack" cocaine    Social History   Socioeconomic History  . Marital status: Single    Spouse name: None  . Number of children: None  . Years of education: None  . Highest education level: None  Social Needs  . Financial resource strain: None  . Food insecurity - worry: None  . Food insecurity - inability: None  . Transportation needs - medical: None  . Transportation needs - non-medical: None  Occupational History  . None  Tobacco Use  . Smoking status: Current Every Day Smoker    Types: Cigarettes  . Smokeless tobacco: Never Used  Substance and Sexual Activity  . Alcohol use: Yes  . Drug use: Yes  Types: "Crack" cocaine  . Sexual activity: No  Other Topics Concern  . None  Social History Narrative  . None   Additional Social History:   Sleep: Fair  Appetite:  Good  Current Medications: Current Facility-Administered Medications  Medication Dose Route Frequency Provider Last Rate Last Dose  . acetaminophen (TYLENOL) tablet 650 mg  650 mg Oral Q6H PRN Laveda Abbe, NP   650 mg at 10/14/17 1603  . alum & mag  hydroxide-simeth (MAALOX/MYLANTA) 200-200-20 MG/5ML suspension 30 mL  30 mL Oral Q4H PRN Laveda Abbe, NP      . DULoxetine (CYMBALTA) DR capsule 30 mg  30 mg Oral Daily Micheal Likens, MD   30 mg at 10/18/17 0819  . hydrOXYzine (ATARAX/VISTARIL) tablet 50 mg  50 mg Oral Q6H PRN Micheal Likens, MD   50 mg at 10/18/17 1531  . ibuprofen (ADVIL,MOTRIN) tablet 600 mg  600 mg Oral Q6H PRN Micheal Likens, MD      . levothyroxine (SYNTHROID, LEVOTHROID) tablet 100 mcg  100 mcg Oral QAC breakfast Laveda Abbe, NP   100 mcg at 10/18/17 0630  . magnesium hydroxide (MILK OF MAGNESIA) suspension 30 mL  30 mL Oral Daily PRN Laveda Abbe, NP   30 mL at 10/15/17 1150  . nicotine (NICODERM CQ - dosed in mg/24 hours) patch 21 mg  21 mg Transdermal Daily Micheal Likens, MD   21 mg at 10/18/17 0818  . OLANZapine (ZYPREXA) tablet 10 mg  10 mg Oral QHS Micheal Likens, MD   10 mg at 10/17/17 2103  . OLANZapine zydis (ZYPREXA) disintegrating tablet 5 mg  5 mg Oral Q8H PRN Micheal Likens, MD   5 mg at 10/15/17 1304  . ondansetron (ZOFRAN) tablet 4 mg  4 mg Oral Q8H PRN Laveda Abbe, NP      . pantoprazole (PROTONIX) EC tablet 40 mg  40 mg Oral Daily Laveda Abbe, NP   40 mg at 10/18/17 0818  . prazosin (MINIPRESS) capsule 3 mg  3 mg Oral QHS Micheal Likens, MD   3 mg at 10/17/17 2103  . traZODone (DESYREL) tablet 150 mg  150 mg Oral QHS,MR X 1 Kerry Hough, PA-C   150 mg at 10/17/17 2355   Lab Results: No results found for this or any previous visit (from the past 48 hour(s)).  Blood Alcohol level:  Lab Results  Component Value Date   ETH <10 10/13/2017   ETH 70 (H) 06/18/2012   Metabolic Disorder Labs: No results found for: HGBA1C, MPG No results found for: PROLACTIN No results found for: CHOL, TRIG, HDL, CHOLHDL, VLDL, LDLCALC  Physical Findings: AIMS: Facial and Oral Movements Muscles of  Facial Expression: None, normal Lips and Perioral Area: None, normal Jaw: None, normal Tongue: None, normal,Extremity Movements Upper (arms, wrists, hands, fingers): None, normal Lower (legs, knees, ankles, toes): None, normal, Trunk Movements Neck, shoulders, hips: None, normal, Overall Severity Severity of abnormal movements (highest score from questions above): None, normal Incapacitation due to abnormal movements: None, normal Patient's awareness of abnormal movements (rate only patient's report): No Awareness, Dental Status Current problems with teeth and/or dentures?: No Does patient usually wear dentures?: No  CIWA:    COWS:     Musculoskeletal: Strength & Muscle Tone: within normal limits Gait & Station: normal Patient leans: N/A  Psychiatric Specialty Exam: Physical Exam  Nursing note and vitals reviewed.   Review of Systems  Constitutional: Negative  for chills and fever.  Respiratory: Negative for cough and shortness of breath.   Cardiovascular: Negative for chest pain.  Gastrointestinal: Negative for abdominal pain, heartburn, nausea and vomiting.  Psychiatric/Behavioral: Negative for depression, hallucinations and suicidal ideas. The patient is nervous/anxious.     Blood pressure 105/70, pulse (!) 125, temperature 97.8 F (36.6 C), temperature source Oral, resp. rate 18, height 6\' 4"  (1.93 m), weight 108.9 kg (240 lb).Body mass index is 29.21 kg/m.  General Appearance: Casual and Well Groomed  Eye Contact:  Good  Speech:  Clear and Coherent and Normal Rate  Volume:  Normal  Mood:  Irritable  Affect:  Constricted  Thought Process:  Coherent and Goal Directed  Orientation:  Full (Time, Place, and Person)  Thought Content:  Logical  Suicidal Thoughts:  No  Homicidal Thoughts:  No  Memory:  Immediate;   Fair Recent;   Fair Remote;   Fair  Judgement:  Fair  Insight:  Fair  Psychomotor Activity:  Normal  Concentration:  Concentration: Fair  Recall:  [employer] of Knowledge:  Fair  Language:  Fair  Akathisia:  No  Handed:    AIMS (if indicated):     Assets:  Communication Skills Desire for Improvement Financial Resources/Insurance Physical Health Resilience Social Support  ADL's:  Intact  Cognition:  WNL  Sleep:  Number of Hours: 6.75   Treatment Plan Summary: Daily contact with patient to assess and evaluate symptoms and progress in treatment and Medication management; Continue inpatient hospitalization.  -Will continue today 10/18/2017 plan as below except where it is noted.  -Bipolar I, current episode depressed with psychotic features             - Continue zyprexa 10mg  po qhs             - Continue cymbalta 30mg  po qDay  -PTSD             - Continue prazosin 3mg  po qhs  - Anxiety             - Continue atarax 50mg  po q6h prn anxiety  -Agitation/psychosis             - Continue zydis 5mg  po q8h prn psychosis/agitation  - Insomnia             - Continue trazodone 150mg  po qhs and may repeat x1 PRN  -GERD              -Continue protonix 40mg  po qDay  -Hypothyroidism             - Continue synthroid qDay  - Encourage participation in groups and therapeutic milieu  -Discharge planning will be ongoing  Armandina Stammer, NP, PMHNP, FNP-BC. 10/18/2017, 3:34 PMPatient ID: Brandon [name], male   DOB: [birth year], 42 y.o.   MRN: [id number]

## 2017-10-18 NOTE — [facility] Group Notes (Signed)
LCSW Group Therapy Note  10/18/2017 10:00AM - 11:15 - [address], 11:15-12:00 - [address]  Type of Therapy and Topic:  Group Therapy: Anger Cues and Responses  Participation Level:  Did Not Attend   Description of Group:   In this group, patients learned how to recognize the physical, cognitive, emotional, and behavioral responses they have to anger-provoking situations.  They identified a recent time they became angry and how they reacted.  They analyzed how their reaction was possibly beneficial and how it was possibly unhelpful.  The group discussed a variety of healthier coping skills that could help with such a situation in the future.  Deep breathing was practiced briefly.  Therapeutic Goals: 1. Patients will remember their last incident of anger and how they felt emotionally and physically, what their thoughts were at the time, and how they behaved. 2. Patients will identify how their behavior at that time worked for them, as well as how it worked against them. 3. Patients will explore possible new behaviors to use in future anger situations. 4. Patients will learn that anger itself is normal and cannot be eliminated, and that healthier reactions can assist with resolving conflict rather than worsening situations.  Summary of Patient Progress:  N/A  Therapeutic Modalities:   Cognitive Behavioral Therapy  Lynnell ChadMareida J Grossman-Orr  10/18/2017 9:09 AM

## 2017-10-18 NOTE — Progress Notes (Signed)
D: Pt denies SI/HI/AVH. Pt is pleasant and cooperative. Pt stated he had a good day except for another patient on the unit " Victorino DikeJennifer" , that was harassing him and making him mad.   A: Pt was offered support and encouragement. Pt was given scheduled medications. Pt was encourage to attend groups. Q 15 minute checks were done for safety.   R:Pt attends groups and interacts well with peers and staff. Pt is taking medication. Pt receptive to treatment and safety maintained on unit.

## 2017-10-18 NOTE — Progress Notes (Signed)
Adult Psychoeducational Group Note  Date:  10/18/2017 Time:  8:49 PM  Group Topic/Focus:  Wrap-Up Group:   The focus of this group is to help patients review their daily goal of treatment and discuss progress on daily workbooks.  Participation Level:  Active  Participation Quality:  Appropriate  Affect:  Appropriate  Cognitive:  Appropriate  Insight: Appropriate  Engagement in Group:  Engaged  Modes of Intervention:  Discussion  Additional Comments:  The patient expressed that he rates today a 6.  Octavio Mannshigpen, Marti Acebo Lee 10/18/2017, 8:49 PM

## 2017-10-18 NOTE — Progress Notes (Signed)
Patient ID: Brandon [name], male   DOB: 09/02/1975, 42 y.o.   MRN: [id number]    D: Pt has been very flat and depressed on the unit today. Pt remained in the bed most of the day, he did not attend any groups nor did he engage in any treatment. Pt took all medication without any problems, no issues or concerns noted. Pt reported that his depression was a 7, her anxiety was a 7, and her anxiety was a 7. Pt reported that his goal for today was to rest. Pt reported being negative SI/HI, no AH/VH noted. A: 15 min checks continued for patient safety. R: Pt safety maintained.

## 2017-10-18 NOTE — [facility] Group Notes (Signed)
[facility] Group Notes:  (Nursing/MHT/Case Management/Adjunct)  Date:  10/18/2017  Time:  9:56 AM  Type of Therapy:  Psychoeducational Skills  Participation Level:  Did Not Attend  Participation Quality:  Did not attend  Affect:  Did not attend  Cognitive:  Did not attend  Insight:  None  Engagement in Group:  Did not attend  Modes of Intervention:  Did not attend  Summary of Progress/Problems: Pt did not attend Psychoeducational group with topic anger management.   Gayanne Prescott Shanta 10/18/2017, 9:56 AM 

## 2017-10-19 MED ORDER — TRAZODONE HCL 150 MG PO TABS
300.0000 mg | ORAL_TABLET | Freq: Every day | ORAL | Status: DC
  Administered 2017-10-19 – 2017-10-20 (×2): 300 mg via ORAL
  Filled 2017-10-19: qty 2
  Filled 2017-10-19: qty 14
  Filled 2017-10-19: qty 2
  Filled 2017-10-19: qty 14
  Filled 2017-10-19: qty 2

## 2017-10-19 MED ORDER — LEVOTHYROXINE SODIUM 88 MCG PO TABS
88.0000 ug | ORAL_TABLET | Freq: Every day | ORAL | Status: DC
  Administered 2017-10-20 – 2017-10-21 (×2): 88 ug via ORAL
  Filled 2017-10-19: qty 7
  Filled 2017-10-19 (×4): qty 1
  Filled 2017-10-19: qty 7

## 2017-10-19 NOTE — Progress Notes (Signed)
[facility] MD Progress Note  10/19/2017 3:54 PM Brandon [name]  MRN:  [id number]  Subjective: Brandon [name] reports, "I'm a little agitated this morning because of one particular patient. I have taken anxiety medicine few minutes ago, waiting for it to kick in. I had a hard time sleeping last night because of this one particular patient. I guess everyone else felt the same way. She was very disruptive. I'm trying to catch some nap today. I don't think that I will be attending any group sessions today".  Objective: Brandon [name] is a 42 y/o M with history of treatment for depression, ADHD, anxiety, insomnia, and substance abuse of stimulants and benzodiazepines who was admitted on IVC with worsening symptoms of depression and psychosis in the context of worsening substance use of alcohol, crack cocaine, benzodiazepines, and recent suicide attempt via overdose of Nuvigil. Pt was changed from home medication of seroquel to zyprexa to address depression and psychosis symptoms. He was also restarted on previous medication of cymbalta for depression symptoms, and he was started on prazosin for addressing symptoms of nightmares, and dose was titrated up during his stay. He has been working with SW team about arranging for referral to substance use treatment after discharge.  Today upon evaluation, pt shares, "It's going okay, just a little agitated; I'm just tired this morning because he could not stay asleep last night." Pt reports that overall he is doing well and feels that his mood and anxiety symptoms are improving significantly during his stay. He denies SI/HI/AH/VH.  Pt denies any physical complaints. He is tolerating his medications well without side effects or difficulty. He is in agreement to continue on his current regimen with only change being to increase dose of prazosin last night. Pt has identified [facility] as his preference for substance use treatment, and he will work with SW team in  regards to taking next step for referral. He is encouraged to attend group sessions for some coping skills.  Principal Problem: Bipolar I disorder, current or most recent episode depressed, with psychotic features [facility]) Diagnosis:   Patient Active Problem List   Diagnosis Date Noted  . PTSD (post-traumatic stress disorder) [F43.10] 10/15/2017  . Bipolar I disorder, current or most recent episode depressed, with psychotic features (HCC) [F31.5] 10/14/2017   Total Time spent with patient: 15 minutes  Past Psychiatric History: See H&P  Past Medical History:  Past Medical History:  Diagnosis Date  . Anxiety   . Depression   . Hyperlipidemia   . Substance abuse (HCC)   . Thyroid disease    History reviewed. No pertinent surgical history.  Family History:  Family History  Problem Relation Age of Onset  . Diabetes Father    Family Psychiatric  History: See H&P  Social History:  Social History   Substance and Sexual Activity  Alcohol Use Yes     Social History   Substance and Sexual Activity  Drug Use Yes  . Types: "Crack" cocaine    Social History   Socioeconomic History  . Marital status: Single    Spouse name: None  . Number of children: None  . Years of education: None  . Highest education level: None  Social Needs  . Financial resource strain: None  . Food insecurity - worry: None  . Food insecurity - inability: None  . Transportation needs - medical: None  . Transportation needs - non-medical: None  Occupational History  . None  Tobacco Use  . Smoking status: Current Every  Day Smoker    Types: Cigarettes  . Smokeless tobacco: Never Used  Substance and Sexual Activity  . Alcohol use: Yes  . Drug use: Yes    Types: "Crack" cocaine  . Sexual activity: No  Other Topics Concern  . None  Social History Narrative  . None   Additional Social History:   Sleep: Fair  Appetite:  Good  Current Medications: Current Facility-Administered Medications   Medication Dose Route Frequency Provider Last Rate Last Dose  . acetaminophen (TYLENOL) tablet 650 mg  650 mg Oral Q6H PRN Laveda AbbeParks, Laurie Britton, NP   650 mg at 10/14/17 1603  . alum & mag hydroxide-simeth (MAALOX/MYLANTA) 200-200-20 MG/5ML suspension 30 mL  30 mL Oral Q4H PRN Laveda AbbeParks, Laurie Britton, NP      . DULoxetine (CYMBALTA) DR capsule 30 mg  30 mg Oral Daily Micheal Likensainville, Christopher T, MD   30 mg at 10/19/17 1103  . hydrOXYzine (ATARAX/VISTARIL) tablet 50 mg  50 mg Oral Q6H PRN Micheal Likensainville, Christopher T, MD   50 mg at 10/19/17 1104  . ibuprofen (ADVIL,MOTRIN) tablet 600 mg  600 mg Oral Q6H PRN Micheal Likensainville, Christopher T, MD      . Melene Muller[START ON 10/20/2017] levothyroxine (SYNTHROID, LEVOTHROID) tablet 88 mcg  88 mcg Oral QAC breakfast Jolyne Loaainville, Christopher T, MD      . magnesium hydroxide (MILK OF MAGNESIA) suspension 30 mL  30 mL Oral Daily PRN Laveda AbbeParks, Laurie Britton, NP   30 mL at 10/15/17 1150  . nicotine (NICODERM CQ - dosed in mg/24 hours) patch 21 mg  21 mg Transdermal Daily Micheal Likensainville, Christopher T, MD   21 mg at 10/19/17 1104  . OLANZapine (ZYPREXA) tablet 10 mg  10 mg Oral QHS Micheal Likensainville, Christopher T, MD   10 mg at 10/18/17 2102  . OLANZapine zydis (ZYPREXA) disintegrating tablet 5 mg  5 mg Oral Q8H PRN Micheal Likensainville, Christopher T, MD   5 mg at 10/15/17 1304  . ondansetron (ZOFRAN) tablet 4 mg  4 mg Oral Q8H PRN Laveda AbbeParks, Laurie Britton, NP      . pantoprazole (PROTONIX) EC tablet 40 mg  40 mg Oral Daily Laveda AbbeParks, Laurie Britton, NP   40 mg at 10/19/17 1103  . prazosin (MINIPRESS) capsule 3 mg  3 mg Oral QHS Micheal Likensainville, Christopher T, MD   3 mg at 10/18/17 2102  . traZODone (DESYREL) tablet 300 mg  300 mg Oral QHS Nwoko, Agnes I, NP       Lab Results: No results found for this or any previous visit (from the past 48 hour(s)).  Blood Alcohol level:  Lab Results  Component Value Date   ETH <10 10/13/2017   ETH 70 (H) 06/18/2012   Metabolic Disorder Labs: No results found for: HGBA1C, MPG No  results found for: PROLACTIN No results found for: CHOL, TRIG, HDL, CHOLHDL, VLDL, LDLCALC  Physical Findings: AIMS: Facial and Oral Movements Muscles of Facial Expression: None, normal Lips and Perioral Area: None, normal Jaw: None, normal Tongue: None, normal,Extremity Movements Upper (arms, wrists, hands, fingers): None, normal Lower (legs, knees, ankles, toes): None, normal, Trunk Movements Neck, shoulders, hips: None, normal, Overall Severity Severity of abnormal movements (highest score from questions above): None, normal Incapacitation due to abnormal movements: None, normal Patient's awareness of abnormal movements (rate only patient's report): No Awareness, Dental Status Current problems with teeth and/or dentures?: No Does patient usually wear dentures?: No  CIWA:    COWS:     Musculoskeletal: Strength & Muscle Tone: within normal limits Gait &  Station: normal Patient leans: N/A  Psychiatric Specialty Exam: Physical Exam  Nursing note and vitals reviewed.   Review of Systems  Constitutional: Negative for chills and fever.  Respiratory: Negative for cough and shortness of breath.   Cardiovascular: Negative for chest pain.  Gastrointestinal: Negative for abdominal pain, heartburn, nausea and vomiting.  Psychiatric/Behavioral: Negative for depression, hallucinations and suicidal ideas. The patient is nervous/anxious.     Blood pressure 110/70, pulse (!) 105, temperature 98.1 F (36.7 C), temperature source Oral, resp. rate 20, height 6\' 4"  (1.93 m), weight 108.9 kg (240 lb).Body mass index is 29.21 kg/m.  General Appearance: Casual and Well Groomed  Eye Contact:  Good  Speech:  Clear and Coherent and Normal Rate  Volume:  Normal  Mood:  Irritable  Affect:  Constricted  Thought Process:  Coherent and Goal Directed  Orientation:  Full (Time, Place, and Person)  Thought Content:  Logical  Suicidal Thoughts:  No  Homicidal Thoughts:  No  Memory:  Immediate;    Fair Recent;   Fair Remote;   Fair  Judgement:  Fair  Insight:  Fair  Psychomotor Activity:  Normal  Concentration:  Concentration: Fair  Recall:  [employer] of Knowledge:  Fair  Language:  Fair  Akathisia:  No  Handed:    AIMS (if indicated):     Assets:  Communication Skills Desire for Improvement Financial Resources/Insurance Physical Health Resilience Social Support  ADL's:  Intact  Cognition:  WNL  Sleep:  Number of Hours: 6.75   Treatment Plan Summary: Daily contact with patient to assess and evaluate symptoms and progress in treatment and Medication management; Continue inpatient hospitalization.  -Will continue today 10/19/2017 plan as below except where it is noted.  -Bipolar I, current episode depressed with psychotic features             - Continue zyprexa 10mg  po qhs             - Continue cymbalta 30mg  po qDay  -PTSD             - Continue prazosin 3mg  po qhs  - Anxiety             - Continue atarax 50mg  po q6h prn anxiety  -Agitation/psychosis             - Continue zydis 5mg  po q8h prn psychosis/agitation  - Insomnia             - Continue trazodone 150mg  po qhs and may repeat x1 PRN  -GERD              -Continue protonix 40mg  po qDay  -Hypothyroidism             - Continue synthroid qDay  - Encourage participation in groups and therapeutic milieu  -Discharge planning will be ongoing  Armandina Stammer, NP, PMHNP, FNP-BC. 10/19/2017, 3:54 PMPatient ID: Brandon [name], male   DOB: Feb 09, 1976, 42 y.o.   MRN: [id number]

## 2017-10-19 NOTE — Progress Notes (Signed)
D: Pt was in the dayroom upon initial approach.  Pt presents with anxious affect and mood.  He reports his day was "alright" and his goal was to "just rest, I got everything lined up for aftercare."  Pt reports he hopes to discharge tomorrow and he feels safe to do so.  He plans to go to [facility] in [place] for aftercare.  Pt denies SI/HI, denies hallucinations, denies pain.  Pt has been visible in milieu.  He was irritated with peer earlier tonight but was easily de-escalated.  Pt attended evening group.    A: Introduced self to pt.  Actively listened to pt and offered support and encouragement. Medications administered per order.  PRN medication administered for anxiety.  Q15 minute safety checks maintained.  R: Pt is safe on the unit.  Pt is compliant with medications.  Pt verbally contracts for safety.  Will continue to monitor and assess.

## 2017-10-19 NOTE — [facility] Group Notes (Signed)
[facility] LCSW Group Therapy Note  Date/Time:  10/19/2017  11:00AM-12:00PM  Type of Therapy and Topic:  Group Therapy:  Music and Mood  Participation Level:  Did Not Attend   Description of Group: In this process group, members listened to a variety of genres of music and identified that different types of music evoke different responses.  Patients were encouraged to identify music that was soothing for them and music that was energizing for them.  Patients discussed how this knowledge can help with wellness and recovery in various ways including managing depression and anxiety as well as encouraging healthy sleep habits.    Therapeutic Goals: 1. Patients will explore the impact of different varieties of music on mood 2. Patients will verbalize the thoughts they have when listening to different types of music 3. Patients will identify music that is soothing to them as well as music that is energizing to them 4. Patients will discuss how to use this knowledge to assist in maintaining wellness and recovery 5. Patients will explore the use of music as a coping skill  Summary of Patient Progress:  N/A  Therapeutic Modalities: Solution Focused Brief Therapy Activity   Raymel Cull Grossman-Orr, LCSW    

## 2017-10-19 NOTE — Progress Notes (Signed)
Patient has been isolative to the room this shift. Patient has complained about feeling antagonized by peer on the unit which has caused him to feel agitated.  Patient denies SI, HI and AVH.   Assess patient for safety, offer medications as prescribed, engage patient in 1:1 staff talks.   Patient able to contract for safety.  Continue to monitor for safety.

## 2017-10-19 NOTE — [facility] Group Notes (Signed)
Adult Psychoeducational Group Note  Date:  10/19/2017 Time:  10:44 PM  Group Topic/Focus:  Wrap-Up Group:   The focus of this group is to help patients review their daily goal of treatment and discuss progress on daily workbooks.  Participation Level:  Active  Participation Quality:  Appropriate and Attentive  Affect:  Appropriate  Cognitive:  Alert and Appropriate  Insight: Appropriate and Good  Engagement in Group:  Engaged  Modes of Intervention:  Discussion and Education  Additional Comments:  Pt attended and participated in wrap up group this evening. Pt spent their day hiding in their room to avoid pt they had conflict with. Pt did not have a goal, they just rested for the day. Pt had not positives to relay to Clinical research associatewriter.   Brandon NettersOctavia A Halia Franey 10/19/2017, 10:44 PM

## 2017-10-20 NOTE — Tx Team (Signed)
Interdisciplinary Treatment and Diagnostic Plan Update  10/20/2017 Time of Session: 0945  Brandon ChromanKori Slemmer MRN: [id number]  Principal Diagnosis: Bipolar I disorder, current or most recent episode depressed, with psychotic features (HCC)  Secondary Diagnoses: Principal Problem:   Bipolar I disorder, current or most recent episode depressed, with psychotic features (HCC) Active Problems:   PTSD (post-traumatic stress disorder)   Current Medications:  Current Facility-Administered Medications  Medication Dose Route Frequency Provider Last Rate Last Dose  . acetaminophen (TYLENOL) tablet 650 mg  650 mg Oral Q6H PRN Laveda AbbeParks, Laurie Britton, NP   650 mg at 10/14/17 1603  . alum & mag hydroxide-simeth (MAALOX/MYLANTA) 200-200-20 MG/5ML suspension 30 mL  30 mL Oral Q4H PRN Laveda AbbeParks, Laurie Britton, NP      . DULoxetine (CYMBALTA) DR capsule 30 mg  30 mg Oral Daily Micheal Likensainville, Christopher T, MD   30 mg at 10/20/17 0759  . hydrOXYzine (ATARAX/VISTARIL) tablet 50 mg  50 mg Oral Q6H PRN Micheal Likensainville, Christopher T, MD   50 mg at 10/20/17 0800  . ibuprofen (ADVIL,MOTRIN) tablet 600 mg  600 mg Oral Q6H PRN Micheal Likensainville, Christopher T, MD      . levothyroxine (SYNTHROID, LEVOTHROID) tablet 88 mcg  88 mcg Oral QAC breakfast Micheal Likensainville, Christopher T, MD   88 mcg at 10/20/17 0759  . magnesium hydroxide (MILK OF MAGNESIA) suspension 30 mL  30 mL Oral Daily PRN Laveda AbbeParks, Laurie Britton, NP   30 mL at 10/15/17 1150  . nicotine (NICODERM CQ - dosed in mg/24 hours) patch 21 mg  21 mg Transdermal Daily Micheal Likensainville, Christopher T, MD   21 mg at 10/20/17 0759  . OLANZapine (ZYPREXA) tablet 10 mg  10 mg Oral QHS Micheal Likensainville, Christopher T, MD   10 mg at 10/19/17 2100  . OLANZapine zydis (ZYPREXA) disintegrating tablet 5 mg  5 mg Oral Q8H PRN Micheal Likensainville, Christopher T, MD   5 mg at 10/15/17 1304  . ondansetron (ZOFRAN) tablet 4 mg  4 mg Oral Q8H PRN Laveda AbbeParks, Laurie Britton, NP      . pantoprazole (PROTONIX) EC tablet 40 mg  40 mg Oral  Daily Laveda AbbeParks, Laurie Britton, NP   40 mg at 10/20/17 0759  . prazosin (MINIPRESS) capsule 3 mg  3 mg Oral QHS Micheal Likensainville, Christopher T, MD   3 mg at 10/19/17 2059  . traZODone (DESYREL) tablet 300 mg  300 mg Oral QHS Nwoko, Agnes I, NP   300 mg at 10/19/17 2100    PTA Medications: Medications Prior to Admission  Medication Sig Dispense Refill Last Dose  . Armodafinil (NUVIGIL) 250 MG tablet Take 250 mg by mouth daily.   10/13/2017 at Unknown time  . cyclobenzaprine (FLEXERIL) 10 MG tablet Take 1 tablet (10 mg total) by mouth 2 (two) times daily as needed for muscle spasms. (Patient not taking: Reported on 10/13/2017) 20 tablet 0 Completed Course at Unknown time  . levothyroxine (SYNTHROID, LEVOTHROID) 100 MCG tablet Take 100 mcg by mouth daily before breakfast.   10/12/2017 at Unknown time  . meloxicam (MOBIC) 7.5 MG tablet Take 1-2 tabs daily for 5 days, then 1 daily as needed for pain (Patient not taking: Reported on 10/13/2017) 30 tablet 0 Completed Course at Unknown time  . omeprazole (PRILOSEC) 20 MG capsule Take 20 mg by mouth daily.   10/12/2017 at Unknown time  . ondansetron (ZOFRAN) 4 MG tablet Take 1 tablet (4 mg total) by mouth every 6 (six) hours. 12 tablet 0   . QUEtiapine (SEROQUEL) 300 MG tablet Take  300 mg by mouth at bedtime.   10/12/2017 at Unknown time  . traZODone (DESYREL) 100 MG tablet Take 100 mg by mouth at bedtime.   10/12/2017 at Unknown time    Treatment Modalities: Medication Management, Group therapy, Case management,  1 to 1 session with clinician, Psychoeducation, Recreational therapy.  Patient Stressors: Medication change or noncompliance Substance abuse  Patient Strengths: Ability for insight Average or above average intelligence Capable of independent living General fund of knowledge Motivation for treatment/growth   Physician Treatment Plan for Primary Diagnosis: Bipolar I disorder, current or most recent episode depressed, with psychotic features (HCC) Long  Term Goal(s): Improvement in symptoms so as ready for discharge  Short Term Goals: Ability to identify triggers associated with substance abuse/mental health issues will improve Ability to identify and develop effective coping behaviors will improve  Medication Management: Evaluate patient's response, side effects, and tolerance of medication regimen.  Therapeutic Interventions: 1 to 1 sessions, Unit Group sessions and Medication administration.  Evaluation of Outcomes: Progressing  Physician Treatment Plan for Secondary Diagnosis: Principal Problem:   Bipolar I disorder, current or most recent episode depressed, with psychotic features (HCC) Active Problems:   PTSD (post-traumatic stress disorder)  Long Term Goal(s): Improvement in symptoms so as ready for discharge  Short Term Goals: Ability to identify triggers associated with substance abuse/mental health issues will improve Ability to identify and develop effective coping behaviors will improve  Medication Management: Evaluate patient's response, side effects, and tolerance of medication regimen.  Therapeutic Interventions: 1 to 1 sessions, Unit Group sessions and Medication administration.  Evaluation of Outcomes: Progressing   RN Treatment Plan for Primary Diagnosis: Bipolar I disorder, current or most recent episode depressed, with psychotic features (HCC) Long Term Goal(s): Knowledge of disease and therapeutic regimen to maintain health will improve  Short Term Goals: Ability to demonstrate self-control, Ability to participate in decision making will improve, Ability to identify and develop effective coping behaviors will improve and Compliance with prescribed medications will improve  Medication Management: RN will administer medications as ordered by provider, will assess and evaluate patient's response and provide education to patient for prescribed medication. RN will report any adverse and/or side effects to prescribing  provider.  Therapeutic Interventions: 1 on 1 counseling sessions, Psychoeducation, Medication administration, Evaluate responses to treatment, Monitor vital signs and CBGs as ordered, Perform/monitor CIWA, COWS, AIMS and Fall Risk screenings as ordered, Perform wound care treatments as ordered.  Evaluation of Outcomes: Progressing   LCSW Treatment Plan for Primary Diagnosis: Bipolar I disorder, current or most recent episode depressed, with psychotic features (HCC) Long Term Goal(s): Safe transition to appropriate next level of care at discharge, Engage patient in therapeutic group addressing interpersonal concerns.  Short Term Goals: Engage patient in aftercare planning with referrals and resources, Facilitate acceptance of mental health diagnosis and concerns, Facilitate patient progression through stages of change regarding substance use diagnoses and concerns, Identify triggers associated with mental health/substance abuse issues and Increase skills for wellness and recovery  Therapeutic Interventions: Assess for all discharge needs, 1 to 1 time with Social worker, Explore available resources and support systems, Assess for adequacy in community support network, Educate family and significant other(s) on suicide prevention, Complete Psychosocial Assessment, Interpersonal group therapy.  Evaluation of Outcomes: Progressing   Progress in Treatment: Attending groups: No Participating in groups: No Taking medication as prescribed: Yes Toleration of medication: Yes, no side effects reported at this time Family/Significant other contact made: Yes, with father. Patient understands diagnosis: Yes  AEB asking for help with medications and substance use  Discussing patient identified problems/goals with staff: Yes Medical problems stabilized or resolved: Yes Denies suicidal/homicidal ideation: Yes Issues/concerns per patient self-inventory: None Other: N/A  New problem(s) identified: None  identified at this time.   New Short Term/Long Term Goal(s): "Medication management, help with addiction, and getting on medicaid"   Discharge Plan or Barriers: Pt would like to attend a long term substance use treatment center.    Reason for Continuation of Hospitalization: Depression Hallucinations Medication stabilization    Estimated Length of Stay: 1 day.  Attendees: Patient: 10/20/2017   Physician: Dr Altamese Pastoria, MD 10/20/2017   Nursing: Liborio Nixon, RN 10/20/2017   RN Care Manager: 10/20/2017   Social Worker: Daleen Squibb, LCSW 10/20/2017   Recreational Therapist:  10/20/2017   Other: Sherryl Manges, RN 10/20/2017   Other: Hillery Jacks, NP 10/20/2017   Other: 10/20/2017        Scribe for Treatment Team: Lorri Frederick, LCSW 10/20/2017 10:38 AM

## 2017-10-20 NOTE — Progress Notes (Signed)
D: Pt denies SI/HI/AVH. Pt is pleasant and cooperative. Pt stated he was doing good, feeling more like normal. Pt stated another patient on the unit is causing him more anxiety because he is feeling more like normal , so her antics are affecting him more than normal now.   A: Pt was offered support and encouragement. Pt was given scheduled medications. Pt was encourage to attend groups. Q 15 minute checks were done for safety.   R:Pt attends groups and interacts well with peers and staff. Pt is taking medication. Pt receptive to treatment and safety maintained on unit.

## 2017-10-20 NOTE — Progress Notes (Signed)
[facility] MD Progress Note  10/20/2017 11:50 AM Brandon [name]  MRN:  [id number] Subjective:    Brandon [name] is a 42 y/o M with history of treatment for depression, ADHD, anxiety, insomnia, and substance abuse of stimulants and benzodiazepines who was admitted on IVC with worsening symptoms of depression and psychosis in the context of worsening substance use of alcohol, crack cocaine, benzodiazepines, and recent suicide attempt via overdose of Nuvigil.Pt was changed from home medication of seroquel to zyprexa to address depression and psychosis symptoms. He was also restarted on previous medication of cymbalta for depression symptoms, and he was started on prazosin for addressing symptoms of nightmares, and dose was titrated up during his stay. He has been working with SW team about arranging for referral to substance use treatment after discharge.  Today upon evaluation, pt shares, "I'm doing okay - just feel a little aggravated this morning." Pt identifies noise and disturbance from a peer as causing his worsening irritation, but he notes that he has been able to utilize his coping skills to address his concerns. He denies SI/HI/AH/VH. He is sleeping adequately. His appetite is good. Pt notes that he plans to go to substance use treatment in [place], [place] after DC and his father has been working on making arrangements during pt's admission. Pt feels that he will be ready to discharge tomorrow. He would like to continue his current treatment plan without changes.  Principal Problem: Bipolar I disorder, current or most recent episode depressed, with psychotic features [facility]) Diagnosis:   Patient Active Problem List   Diagnosis Date Noted  . PTSD (post-traumatic stress disorder) [F43.10] 10/15/2017  . Bipolar I disorder, current or most recent episode depressed, with psychotic features (HCC) [F31.5] 10/14/2017   Total Time spent with patient: 30 minutes  Past Psychiatric History: see H&P  Past  Medical History:  Past Medical History:  Diagnosis Date  . Anxiety   . Depression   . Hyperlipidemia   . Substance abuse (HCC)   . Thyroid disease    History reviewed. No pertinent surgical history. Family History:  Family History  Problem Relation Age of Onset  . Diabetes Father    Family Psychiatric  History: see H&P Social History:  Social History   Substance and Sexual Activity  Alcohol Use Yes     Social History   Substance and Sexual Activity  Drug Use Yes  . Types: "Crack" cocaine    Social History   Socioeconomic History  . Marital status: Single    Spouse name: None  . Number of children: None  . Years of education: None  . Highest education level: None  Social Needs  . Financial resource strain: None  . Food insecurity - worry: None  . Food insecurity - inability: None  . Transportation needs - medical: None  . Transportation needs - non-medical: None  Occupational History  . None  Tobacco Use  . Smoking status: Current Every Day Smoker    Types: Cigarettes  . Smokeless tobacco: Never Used  Substance and Sexual Activity  . Alcohol use: Yes  . Drug use: Yes    Types: "Crack" cocaine  . Sexual activity: No  Other Topics Concern  . None  Social History Narrative  . None   Additional Social History:                         Sleep: Good  Appetite:  Good  Current Medications: Current Facility-Administered Medications  Medication  Dose Route Frequency Provider Last Rate Last Dose  . acetaminophen (TYLENOL) tablet 650 mg  650 mg Oral Q6H PRN Laveda Abbe, NP   650 mg at 10/14/17 1603  . alum & mag hydroxide-simeth (MAALOX/MYLANTA) 200-200-20 MG/5ML suspension 30 mL  30 mL Oral Q4H PRN Laveda Abbe, NP      . DULoxetine (CYMBALTA) DR capsule 30 mg  30 mg Oral Daily Micheal Likens, MD   30 mg at 10/20/17 0759  . hydrOXYzine (ATARAX/VISTARIL) tablet 50 mg  50 mg Oral Q6H PRN Micheal Likens, MD   50 mg  at 10/20/17 0800  . ibuprofen (ADVIL,MOTRIN) tablet 600 mg  600 mg Oral Q6H PRN Micheal Likens, MD      . levothyroxine (SYNTHROID, LEVOTHROID) tablet 88 mcg  88 mcg Oral QAC breakfast Micheal Likens, MD   88 mcg at 10/20/17 0759  . magnesium hydroxide (MILK OF MAGNESIA) suspension 30 mL  30 mL Oral Daily PRN Laveda Abbe, NP   30 mL at 10/15/17 1150  . nicotine (NICODERM CQ - dosed in mg/24 hours) patch 21 mg  21 mg Transdermal Daily Micheal Likens, MD   21 mg at 10/20/17 0759  . OLANZapine (ZYPREXA) tablet 10 mg  10 mg Oral QHS Micheal Likens, MD   10 mg at 10/19/17 2100  . OLANZapine zydis (ZYPREXA) disintegrating tablet 5 mg  5 mg Oral Q8H PRN Micheal Likens, MD   5 mg at 10/15/17 1304  . ondansetron (ZOFRAN) tablet 4 mg  4 mg Oral Q8H PRN Laveda Abbe, NP      . pantoprazole (PROTONIX) EC tablet 40 mg  40 mg Oral Daily Laveda Abbe, NP   40 mg at 10/20/17 0759  . prazosin (MINIPRESS) capsule 3 mg  3 mg Oral QHS Micheal Likens, MD   3 mg at 10/19/17 2059  . traZODone (DESYREL) tablet 300 mg  300 mg Oral QHS Nwoko, Agnes I, NP   300 mg at 10/19/17 2100    Lab Results: No results found for this or any previous visit (from the past 48 hour(s)).  Blood Alcohol level:  Lab Results  Component Value Date   ETH <10 10/13/2017   ETH 70 (H) 06/18/2012    Metabolic Disorder Labs: No results found for: HGBA1C, MPG No results found for: PROLACTIN No results found for: CHOL, TRIG, HDL, CHOLHDL, VLDL, LDLCALC  Physical Findings: AIMS: Facial and Oral Movements Muscles of Facial Expression: None, normal Lips and Perioral Area: None, normal Jaw: None, normal Tongue: None, normal,Extremity Movements Upper (arms, wrists, hands, fingers): None, normal Lower (legs, knees, ankles, toes): None, normal, Trunk Movements Neck, shoulders, hips: None, normal, Overall Severity Severity of abnormal movements (highest  score from questions above): None, normal Incapacitation due to abnormal movements: None, normal Patient's awareness of abnormal movements (rate only patient's report): No Awareness, Dental Status Current problems with teeth and/or dentures?: No Does patient usually wear dentures?: No  CIWA:    COWS:     Musculoskeletal: Strength & Muscle Tone: within normal limits Gait & Station: normal Patient leans: N/A  Psychiatric Specialty Exam: Physical Exam  Nursing note and vitals reviewed.   Review of Systems  Constitutional: Negative for chills and fever.  Respiratory: Negative for cough and shortness of breath.   Cardiovascular: Negative for chest pain.  Gastrointestinal: Negative for heartburn and nausea.  Psychiatric/Behavioral: Negative for depression, hallucinations and suicidal ideas. The patient is not nervous/anxious.  Blood pressure 102/75, pulse 100, temperature (!) 97.5 F (36.4 C), temperature source Oral, resp. rate 12, height 6\' 4"  (1.93 m), weight 108.9 kg (240 lb).Body mass index is 29.21 kg/m.  General Appearance: Casual and Fairly Groomed  Eye Contact:  Good  Speech:  Clear and Coherent and Normal Rate  Volume:  Normal  Mood:  Irritable  Affect:  Appropriate, Congruent and Constricted  Thought Process:  Coherent and Goal Directed  Orientation:  Full (Time, Place, and Person)  Thought Content:  Logical  Suicidal Thoughts:  No  Homicidal Thoughts:  No  Memory:  Immediate;   Fair Recent;   Fair Remote;   Fair  Judgement:  Fair  Insight:  Fair  Psychomotor Activity:  Normal  Concentration:  Concentration: Fair  Recall:  FiservFair  Fund of Knowledge:  Fair  Language:  Fair  Akathisia:  No  Handed:    AIMS (if indicated):     Assets:  Communication Skills Resilience Social Support  ADL's:  Intact  Cognition:  WNL  Sleep:  Number of Hours: 5.75   Treatment Plan Summary: Daily contact with patient to assess and evaluate symptoms and progress in treatment  and Medication management   -Bipolar I, current episode depressed with psychotic features - Continue zyprexa 10mg  po qhs - Continue cymbalta 30mg  po qDay  -PTSD - Continue prazosin 3mg  po qhs  - Anxiety - Continue atarax 50mg  po q6h prn anxiety  -Agitation/psychosis - Continue zydis 5mg  po q8h prn psychosis/agitation  - Insomnia - Continue trazodone 150mg  po qhs and may repeat x1 PRN  -GERD -Continue protonix 40mg  po qDay  -Hypothyroidism - Continue synthroid 100mcg qDay  - Encourage participation in groups and therapeutic milieu  -Discharge planning will be ongoing   Micheal Likenshristopher T Kimyatta Lecy, MD 10/20/2017, 11:50 AM

## 2017-10-20 NOTE — Progress Notes (Signed)
D: Pt A & O X4. Denies SI, HI, AVH and pain when assessed. Visible in milieu at intervals during shift for medications, meals and snacks. Pt reported "I slept ok, just too much noise from one person getting on people's nerves, but I just walked away from her". Stated to Clinical research associatewriter "[place], so I'm hoping to leave tomorrow". Rates his anxiety 7/10, depression 0/10 and hopelessness 0/10. Reports good appetite and good concentration level. A: Scheduled and PRN (Vistaril) medications given as ordered with verbal education and effects monitored. Pt encouraged to attend groups and voice concerns. Support and availability offered. Safety checks maintained at Q 15 minutes intervals without self harm gestures or outburst.  R: Pt receptive to care. Pt did not attend morning group as encouraged. Pt has been medication compliant. Denies adverse drug reactions when assessed. Remains safe on and off unit.

## 2017-10-20 NOTE — Plan of Care (Signed)
  Safety: Periods of time without injury will increase 10/20/2017 2152 - Progressing by Delos HaringPhillips, Aravind Chrismer A, RN Note Pt safe on the unit at this time

## 2017-10-20 NOTE — Progress Notes (Signed)
Recreation Therapy Notes  Date: 10/20/17 Time: 0955 Location: 500 [address] Dayroom  Group Topic: Coping Skills  Goal Area(s) Addresses:  Patient will be able to identify positive coping skills. Patient will be able to benefits of using coping skills post d/c.  Intervention: AT&TWhite board, dry erase marker, worksheet  Activity: Mind map.  LRT and patients filled in the eight boxes together with anxiety, anger, overwhelmed, exhausted, lack of patience, sickness, loss of loved one and relationships.  Patients were to then come up with at least 3 coping skills for each situation.  The group would then reconvene and LRT would write the coping skills on the board.   Education: PharmacologistCoping Skills, Building control surveyorDischarge Planning.   Education Outcome: Acknowledges understanding/In group clarification offered/Needs additional education.   Clinical Observations/Feedback:  Pt did not attend group.    Caroll RancherMarjette Blain Hunsucker, LRT/CTRS         Caroll RancherLindsay, Bolden Hagerman A 10/20/2017 1:39 PM

## 2017-10-20 NOTE — Plan of Care (Signed)
  Progressing Education: Knowledge of [facility] Education information/materials will improve 10/20/2017 1547 - Progressing by Sherryl MangesWesseh, Farrin Shadle, RN Emotional status will improve 10/20/2017 1547 - Progressing by Sherryl MangesWesseh, Kenetra Hildenbrand, RN Mental status will improve 10/20/2017 1547 - Progressing by Sherryl MangesWesseh, Myleen Brailsford, RN

## 2017-10-20 NOTE — [facility] Group Notes (Signed)
[facility] LCSW Group Therapy Note  Date/Time:10/20/17, 1315  Type of Therapy and Topic:  Group Therapy:  Overcoming Obstacles  Participation Level:  active  Description of Group:    In this group patients will be encouraged to explore what they see as obstacles to their own wellness and recovery. They will be guided to discuss their thoughts, feelings, and behaviors related to these obstacles. The group will process together ways to cope with barriers, with attention given to specific choices patients can make. Each patient will be challenged to identify changes they are motivated to make in order to overcome their obstacles. This group will be process-oriented, with patients participating in exploration of their own experiences as well as giving and receiving support and challenge from other group members.  Therapeutic Goals: 1. Patient will identify personal and current obstacles as they relate to admission. 2. Patient will identify barriers that currently interfere with their wellness or overcoming obstacles.  3. Patient will identify feelings, thought process and behaviors related to these barriers. 4. Patient will identify two changes they are willing to make to overcome these obstacles:    Summary of Patient Progress: Pt shared that addiction is his biggest obstacle.  Pt talked appropriately about this obstacle and then left group soon afterwards.      Therapeutic Modalities:   Cognitive Behavioral Therapy Solution Focused Therapy Motivational Interviewing Relapse Prevention Therapy  Daleen SquibbGreg Jabori Henegar, LCSW

## 2017-10-20 NOTE — Progress Notes (Signed)
Adult Psychoeducational Group Note  Date:  10/20/2017 Time:  9:04 PM  Group Topic/Focus:  Wrap-Up Group:   The focus of this group is to help patients review their daily goal of treatment and discuss progress on daily workbooks.  Participation Level:  Minimal  Participation Quality:  Appropriate  Affect:  Appropriate  Cognitive:  Oriented  Insight: Appropriate  Engagement in Group:  Engaged  Modes of Intervention:  Socialization and Support  Additional Comments:  Patient attended and participated in group tonight. He reports having an OK day. Patient advised that he was leaving tomorrow. He will be going to a treatment program in.  Today he had a visitor.      Lita MainsFrancis, Calen Geister [facility] 10/20/2017, 9:04 PM

## 2017-10-20 NOTE — Progress Notes (Addendum)
CSW [place] to [place] [facility] [place]  [phone].  She has all of pt referral information and was waiting to find out [facility].  Best plan for her would be to arrange admission for tomorrow.   Garner NashGregory Mattie Novosel, MSW, LCSW Clinical Social Worker 10/20/2017 9:08 AM   CSW spoke with pt father, Severiano GilbertBob Crowell.  [place].  [facility] and will be go straight there. Garner NashGregory Shemica Meath, MSW, LCSW Clinical Social Worker 10/20/2017 12:55 PM

## 2017-10-21 MED ORDER — TRAZODONE HCL 300 MG PO TABS: 300.0000 mg | ORAL_TABLET | Freq: Every day | ORAL | 0 refills | Status: DC

## 2017-10-21 MED ORDER — DULOXETINE HCL 30 MG PO CPEP: 30.0000 mg | ORAL_CAPSULE | Freq: Every day | ORAL | 0 refills | Status: DC

## 2017-10-21 MED ORDER — NICOTINE 21 MG/24HR TD PT24: 21.0000 mg | MEDICATED_PATCH | Freq: Every day | TRANSDERMAL | 0 refills | Status: DC

## 2017-10-21 MED ORDER — HYDROXYZINE HCL 50 MG PO TABS: 50.0000 mg | ORAL_TABLET | Freq: Four times a day (QID) | ORAL | 0 refills | Status: DC | PRN

## 2017-10-21 MED ORDER — OMEPRAZOLE 20 MG PO CPDR: 20.0000 mg | DELAYED_RELEASE_CAPSULE | Freq: Every day | ORAL | 0 refills | Status: DC

## 2017-10-21 MED ORDER — ONDANSETRON HCL 4 MG PO TABS: 4.0000 mg | ORAL_TABLET | Freq: Four times a day (QID) | ORAL | 0 refills | Status: DC

## 2017-10-21 MED ORDER — LEVOTHYROXINE SODIUM 88 MCG PO TABS: 88.0000 ug | ORAL_TABLET | Freq: Every day | ORAL | 0 refills | Status: DC

## 2017-10-21 MED ORDER — OLANZAPINE 10 MG PO TABS: 10.0000 mg | ORAL_TABLET | Freq: Every day | ORAL | 0 refills | Status: DC

## 2017-10-21 MED ORDER — PRAZOSIN HCL 1 MG PO CAPS: 3.0000 mg | ORAL_CAPSULE | Freq: Every day | ORAL | 0 refills | Status: DC

## 2017-10-21 MED ORDER — PRAZOSIN HCL 1 MG PO CAPS: 3.0000 mg | ORAL_CAPSULE | Freq: Every day | ORAL | Status: DC

## 2017-10-21 NOTE — [facility] Suicide Risk Assessment (Signed)
[facility]   Principal Problem: Bipolar I disorder, current or most recent episode depressed, with psychotic features [facility](HCC) Discharge Diagnoses:  Patient Active Problem List   Diagnosis Date Noted  . PTSD (post-traumatic stress disorder) [F43.10] 10/15/2017  . Bipolar I disorder, current or most recent episode depressed, with psychotic features (HCC) [F31.5] 10/14/2017    Total Time spent with patient: 30 minutes  Musculoskeletal: Strength & Muscle Tone: within normal limits Gait & Station: normal Patient leans: N/A  Psychiatric Specialty Exam: Review of Systems  Constitutional: Negative for chills and fever.  Respiratory: Negative for cough and shortness of breath.   Cardiovascular: Negative for chest pain.  Gastrointestinal: Negative for abdominal pain, heartburn, nausea and vomiting.  Psychiatric/Behavioral: Negative for depression, hallucinations, substance abuse and suicidal ideas.    Blood pressure 114/79, pulse 92, temperature 97.7 F (36.5 C), temperature source Oral, resp. rate 18, height 6\' 4"  (1.93 m), weight 108.9 kg (240 lb).Body mass index is 29.21 kg/m.  General Appearance: Casual  Eye Contact::  Good  Speech:  Clear and Coherent and Normal Rate  Volume:  Normal  Mood:  Euthymic  Affect:  Appropriate and Congruent  Thought Process:  Coherent and Goal Directed  Orientation:  Full (Time, Place, and Person)  Thought Content:  Logical  Suicidal Thoughts:  No  Homicidal Thoughts:  No  Memory:  Immediate;   Good Recent;   Good Remote;   Good  Judgement:  Fair  Insight:  Fair  Psychomotor Activity:  Normal  Concentration:  Good  Recall:  Good  Fund of Knowledge:Good  Language: Good  Akathisia:  No  Handed:    AIMS (if indicated):     Assets:  Communication Skills Physical Health Resilience  Sleep:  Number of Hours: 6.75  Cognition: WNL  ADL's:  Intact   Mental Status Per Nursing Assessment::   On Admission:      Demographic Factors:  Male, Caucasian and Unemployed  Loss Factors: Decrease in vocational status and Financial problems/change in socioeconomic status  Historical Factors: Family history of mental illness or substance abuse and Impulsivity  Risk Reduction Factors:   Sense of responsibility to family, Positive social support, Positive therapeutic relationship and Positive coping skills or problem solving skills  Continued Clinical Symptoms:  Bipolar Disorder:   Depressive phase Alcohol/Substance Abuse/Dependencies  Cognitive Features That Contribute To Risk:  None    Suicide Risk:  Minimal: No identifiable suicidal ideation.  Patients presenting with no risk factors but with morbid ruminations; may be classified as minimal risk based on the severity of the depressive symptoms  Follow-up Information    [facility]. Go on 10/21/2017.   Why:  [facility] for admission on Tuesday, 10/21/17. Contact information: [address], [place], [place] [address] [phone] F: [phone]        Subjective Data:  Candie ChromanKori Welz is a 42 y/o M with history of treatment for depression, ADHD, anxiety, insomnia, and substance abuse of stimulants and benzodiazepines who was admitted on IVC with worsening symptoms of depression and psychosis in the context of worsening substance use of alcohol, crack cocaine, benzodiazepines, and recent suicide attempt via overdose of Nuvigil.Pt was changed from home medication of seroquel to zyprexa to address depression and psychosis symptoms. He was also restarted on previous medication of cymbalta for depression symptoms, and he was started on prazosin for addressing symptoms of nightmares, and dose was titrated up  during his stay. He has been working with SW team about arranging for referral to substance use treatment after discharge.  Today upon evaluation, pt shares, "I'm doing good -  ready to go. I'm excited about getting into the treatment program in [place]." Pt denies SI/HI/AH/VH. He has been sleeping well. His appetite is good. His tolerating his medications well, and he is in agreement to continue his current regimen without changes. His plan is to attend [facility] for [facility] treatment, and his father will transport him there directly after discharge. He was able to engage in safety planning including plan to return to [facility] or contact emergency services if he feels unable to maintain his own safety or the safety of others. Pt had no further questions, comments, or concerns.    Plan Of Care/Follow-up recommendations:    -Bipolar I, current episode depressed with psychotic features - Continue zyprexa 10mg  po qhs - Continue cymbalta 30mg  po qDay  -PTSD - Continue prazosin3mg  po qhs  - Anxiety - Continue atarax 50mg  po q6h prn anxiety  - Insomnia - Continue trazodone 150mg  po qhs  -GERD -Continue protonix 40mg  po qDay  -Hypothyroidism - Continue synthroid qDay  Activity:  as tolerated Diet:  normal Tests:  NA Other:  see above for DC plan  Micheal Likens, MD 10/21/2017, 9:52 AM

## 2017-10-21 NOTE — Progress Notes (Addendum)
  [facility] Discharge [facility] :  Will you be returning to the same living situation after discharge:  No. [facility] [place] At discharge, do you have transportation home?: Yes,  father Do you have the ability to pay for your medications: Yes,  father  Release of information consent forms completed and in the chart;  Patient's signature needed at discharge.  Patient to Follow up at: Follow-up Information    [facility]. Go on 10/21/2017.   Why:  [facility] for admission on Tuesday, 10/21/17. Contact information: [address], [place], [place] [address] [phone] F: [phone]          Next level of care provider has access to [facility] Link:no  Safety Planning and Suicide Prevention discussed: Yes,  yes  Have you used any form of tobacco in the last 30 days? (Cigarettes, Smokeless Tobacco, Cigars, and/or Pipes): Yes  Has patient been referred to the Quitline?: Patient refused referral  Patient has been referred for addiction treatment: Yes  Ida RogueRodney B Lennette Fader, LCSW 10/21/2017, 9:03 AM

## 2017-10-21 NOTE — Progress Notes (Signed)
Recreation Therapy Notes  INPATIENT RECREATION TR PLAN  Patient Details Name: Laray Corbit MRN: [id number] DOB: 03/07/1976 Today's Date: 10/21/2017  Rec Therapy Plan Is patient appropriate for Therapeutic Recreation?: Yes Treatment times per week: about 3 days Estimated Length of Stay: 5-7 days TR Treatment/Interventions: Group participation (Comment)  Discharge Criteria Pt will be discharged from therapy if:: Discharged Treatment plan/goals/alternatives discussed and agreed upon by:: Patient/family  Discharge Summary Short term goals set: Pt will identify at least 3 positive traits at completion of recreation therapy session. Short term goals met: Adequate for discharge Progress toward goals comments: Groups attended Which groups?: Anger management Reason goals not met: Pt attended one group. Therapeutic equipment acquired: N/A Reason patient discharged from therapy: Discharge from hospital Pt/family agrees with progress & goals achieved: Yes Date patient discharged from therapy: 10/21/17     Victorino Sparrow, LRT/CTRS  Ria Comment, Siyon Linck A 10/21/2017, 9:46 AM

## 2017-10-21 NOTE — Plan of Care (Signed)
Pt showed improved self esteem at completion of recreation therapy session.  Brandon RancherMarjette Tameshia [name], LRT/CTRS

## 2017-10-21 NOTE — Discharge Summary (Addendum)
Physician Discharge Summary Note  Patient:  Brandon [name] is an 42 y.o., male MRN:  [id number] DOB:  14-May-1976 Patient phone:  [phone] (home)  Patient address:   [address] [place] [address],  Total Time spent with patient: Greater than 30 minutes  Date of Admission:  10/14/2017 Date of Discharge: 10-21-17  Reason for Admission: Worsening symptoms of depression, psychosis & increased drug use.  Principal Problem: Bipolar I disorder, current or most recent episode depressed, with psychotic features [facility])  Discharge Diagnoses: Patient Active Problem List   Diagnosis Date Noted  . PTSD (post-traumatic stress disorder) [F43.10] 10/15/2017  . Bipolar I disorder, current or most recent episode depressed, with psychotic features (HCC) [F31.5] 10/14/2017   Past Psychiatric History: Bipolar disorder,   Past Medical History:  Past Medical History:  Diagnosis Date  . Anxiety   . Depression   . Hyperlipidemia   . Substance abuse (HCC)   . Thyroid disease    History reviewed. No pertinent surgical history.  Family History:  Family History  Problem Relation Age of Onset  . Diabetes Father    Family Psychiatric  History: See H&P Social History:  Social History   Substance and Sexual Activity  Alcohol Use Yes     Social History   Substance and Sexual Activity  Drug Use Yes  . Types: "Crack" cocaine    Social History   Socioeconomic History  . Marital status: Single    Spouse name: None  . Number of children: None  . Years of education: None  . Highest education level: None  Social Needs  . Financial resource strain: None  . Food insecurity - worry: None  . Food insecurity - inability: None  . Transportation needs - medical: None  . Transportation needs - non-medical: None  Occupational History  . None  Tobacco Use  . Smoking status: Current Every Day Smoker    Types: Cigarettes  . Smokeless tobacco: Never Used  Substance and Sexual Activity  .  Alcohol use: Yes  . Drug use: Yes    Types: "Crack" cocaine  . Sexual activity: No  Other Topics Concern  . None  Social History Narrative  . None   Hospital Course: Brandon [name] is a 42 y/o M with history of treatment for depression, ADHD, anxiety, insomnia, and substance abuse of stimulants and benzodiazepines who was admitted on IVC with worsening symptoms of depression and psychosis in the context of worsening substance use of alcohol, crack cocaine, benzodiazepines, and recent suicide attempt via overdose of Nuvigil.Pt was changed from home medication of Seroquel to zyprexa 10 mg to address depression and psychosis symptoms. He was also restarted on previous medication of Cymbalta 30 mg for depression symptoms, and he was started on Prazosin 1 mg for addressing symptoms of nightmares, and dose was titrated up during his stay. He has been working with SW team about arranging for referral to substance use treatment after discharge. He also received & was discharged on Trazodone 300 mg for insomnia. He tolerated his treatment regimen without any adverse effects or reactions reported.  Today upon evaluation, pt shares, "I'm doing good - ready to go. I'm excited about getting into the treatment program in [place]." Pt denies SI/HI/AH/VH. He has been sleeping well. His appetite is good. His tolerating his medications well, and he is in agreement to continue his current regimen without changes. His plan is to attend [facility] for substance treatment, and his father will transport him there  directly after discharge. He was able to engage in safety planning including plan to [facility] or contact emergency services if he feels unable to maintain his own safety or the safety of others. Pt had no further questions, comments, or concerns.  Upon discharge, Brandon [name] was both mentally & medically stable. He will continue mental health care as noted below. He was provided with all the necessary  information needed to make this appointment without problems. [place] received a 7 days worth, supply samples of his [facility]. [facility] in no apparent distress. Transportation per father.  Physical Findings: AIMS: Facial and Oral Movements Muscles of Facial Expression: None, normal Lips and Perioral Area: None, normal Jaw: None, normal Tongue: None, normal,Extremity Movements Upper (arms, wrists, hands, fingers): None, normal Lower (legs, knees, ankles, toes): None, normal, Trunk Movements Neck, shoulders, hips: None, normal, Overall Severity Severity of abnormal movements (highest score from questions above): None, normal Incapacitation due to abnormal movements: None, normal Patient's awareness of abnormal movements (rate only patient's report): No Awareness, Dental Status Current problems with teeth and/or dentures?: No Does patient usually wear dentures?: No  CIWA:    COWS:     Musculoskeletal: Strength & Muscle Tone: within normal limits Gait & Station: normal Patient leans: N/A  Psychiatric Specialty Exam: Physical Exam  Constitutional: He is oriented to person, place, and time. He appears well-developed.  HENT:  Head: Normocephalic.  Eyes: Pupils are equal, round, and reactive to light.  Neck: Normal range of motion.  Cardiovascular: Normal rate.  Respiratory: Effort normal.  GI: Soft.  Genitourinary:  Genitourinary Comments: Deferred  Musculoskeletal: Normal range of motion.  Neurological: He is alert and oriented to person, place, and time.  Skin: Skin is warm.    Review of Systems  Constitutional: Negative.   HENT: Negative.   Eyes: Negative.   Respiratory: Negative.   Cardiovascular: Negative.   Gastrointestinal: Negative.   Genitourinary: Negative.   Musculoskeletal: Negative.   Skin: Negative.   Neurological: Negative.   Endo/Heme/Allergies: Negative.   Psychiatric/Behavioral: Positive for depression  (Stable), hallucinations (Hx. Psychosis) and substance abuse (Hx. Barbiturate use). Negative for memory loss and suicidal ideas. The patient has insomnia (Stable). The patient is not nervous/anxious.     Blood pressure 114/79, pulse 92, temperature 97.7 F (36.5 C), temperature source Oral, resp. rate 18, height 6\' 4"  (1.93 m), weight 108.9 kg (240 lb).Body mass index is 29.21 kg/m.  See Md's SRA   Have you used any form of tobacco in the last 30 days? (Cigarettes, Smokeless Tobacco, Cigars, and/or Pipes): Yes  Has this patient used any form of tobacco in the last 30 days? (Cigarettes, Smokeless Tobacco, Cigars, and/or Pipes):Yes, an FDA-approved tobacco cessation medication was offered at discharge.  Blood Alcohol level:  Lab Results  Component Value Date   ETH <10 10/13/2017   ETH 70 (H) 06/18/2012   Metabolic Disorder Labs:  No results found for: HGBA1C, MPG No results found for: PROLACTIN No results found for: CHOL, TRIG, HDL, CHOLHDL, VLDL, LDLCALC  See Psychiatric Specialty Exam and Suicide Risk Assessment completed by Attending Physician prior to discharge.  Discharge destination:  Other:  [facility] [place]  Is patient on multiple antipsychotic therapies at discharge:  No   Has Patient had three or more failed trials of antipsychotic monotherapy by history:  No  Recommended Plan for Multiple Antipsychotic Therapies: NA  Discharge Instructions    Diet - low sodium heart healthy  Complete by:  As directed    Increase activity slowly   Complete by:  As directed      Allergies as of 10/21/2017   No Known Allergies     Medication List    STOP taking these medications   cyclobenzaprine 10 MG tablet Commonly known as:  FLEXERIL   meloxicam 7.5 MG tablet Commonly known as:  MOBIC   NUVIGIL 250 MG tablet Generic drug:  Armodafinil   QUEtiapine 300 MG tablet Commonly known as:  SEROQUEL     TAKE these medications     Indication  DULoxetine 30 MG  capsule Commonly known as:  CYMBALTA Take 1 capsule (30 mg total) by mouth daily. For depression Start taking on:  10/22/2017  Indication:  Major Depressive Disorder, Depression associated with bipolar disorder   hydrOXYzine 50 MG tablet Commonly known as:  ATARAX/VISTARIL Take 1 tablet (50 mg total) by mouth every 6 (six) hours as needed for anxiety.  Indication:  Feeling Anxious   levothyroxine 88 MCG tablet Commonly known as:  SYNTHROID, LEVOTHROID Take 1 tablet (88 mcg total) by mouth daily before breakfast. For thyroid hormone replace Start taking on:  10/22/2017 What changed:    medication strength  how much to take  additional instructions  Indication:  Underactive Thyroid   nicotine 21 mg/24hr patch Commonly known as:  NICODERM CQ - dosed in mg/24 hours Place 1 patch (21 mg total) onto the skin daily. (May buy from over the counter): For smoking cessation Start taking on:  10/22/2017  Indication:  Nicotine Addiction   OLANZapine 10 MG tablet Commonly known as:  ZYPREXA Take 1 tablet (10 mg total) by mouth at bedtime. For mood control  Indication:  Manic-Depression, Mood control   omeprazole 20 MG capsule Commonly known as:  PRILOSEC Take 1 capsule (20 mg total) by mouth daily. For acid reflux What changed:  additional instructions  Indication:  Gastroesophageal Reflux Disease   ondansetron 4 MG tablet Commonly known as:  ZOFRAN Take 1 tablet (4 mg total) by mouth every 6 (six) hours. For nausea What changed:  additional instructions  Indication:  Nausea and Vomiting   prazosin 1 MG capsule Commonly known as:  MINIPRESS Take 3 capsules (3 mg total) by mouth at bedtime. For nightmares  Indication:  PTSD nightmares   trazodone 300 MG tablet Commonly known as:  DESYREL Take 1 tablet (300 mg total) by mouth at bedtime. For sleep What changed:    medication strength  how much to take  additional instructions  Indication:  Trouble Sleeping      Follow-up  Information    [facility]. Go on 10/21/2017.   Why:  Please go directly from [facility] to the [facility] program for admission on Tuesday, 10/21/17. Contact information: [address] [place], [place] [address] [phone] F: [phone]         Follow-up recommendations: Activity:  As tolerated Diet: As recommended by your primary care doctor. Keep all scheduled follow-up appointments as recommended.   Comments: Patient is instructed prior to discharge to: Take all medications as prescribed by his/her mental healthcare provider. Report any adverse effects and or reactions from the medicines to his/her outpatient provider promptly. Patient has been instructed & cautioned: To not engage in alcohol and or illegal drug use while on prescription medicines. In the event of worsening symptoms, patient is instructed to call the crisis hotline, 911 and or go to the nearest ED for appropriate evaluation  and treatment of symptoms. To follow-up with his/her primary care provider for your other medical issues, concerns and or health care needs.   Signed: Armandina Stammer, NP, PMHNP, FNP-BC 10/21/2017, 9:51 AM   Patient seen, Suicide Assessment Completed.  Disposition Plan Reviewed

## 2017-11-25 ENCOUNTER — Ambulatory Visit (HOSPITAL_COMMUNITY)
Admission: RE | Admit: 2017-11-25 | Discharge: 2017-11-25 | Disposition: A | Discharge status: Home / Self Care | Payer: Self-pay | Attending: Psychiatry | Admitting: Psychiatry

## 2017-11-25 ENCOUNTER — Emergency Department (HOSPITAL_COMMUNITY)
Admission: EM | Admit: 2017-11-25 | Discharge: 2017-11-26 | Disposition: A | Discharge status: Other Acute Inpatient Hospital | Payer: Self-pay | Attending: Emergency Medicine | Admitting: Emergency Medicine

## 2017-11-25 ENCOUNTER — Encounter (HOSPITAL_COMMUNITY): Payer: Self-pay

## 2017-11-25 DIAGNOSIS — F1910 Other psychoactive substance abuse, uncomplicated: Secondary | ICD-10-CM | POA: Insufficient documentation

## 2017-11-25 DIAGNOSIS — R945 Abnormal results of liver function studies: Secondary | ICD-10-CM | POA: Insufficient documentation

## 2017-11-25 DIAGNOSIS — F17210 Nicotine dependence, cigarettes, uncomplicated: Secondary | ICD-10-CM | POA: Insufficient documentation

## 2017-11-25 DIAGNOSIS — F332 Major depressive disorder, recurrent severe without psychotic features: Secondary | ICD-10-CM | POA: Insufficient documentation

## 2017-11-25 DIAGNOSIS — Z79899 Other long term (current) drug therapy: Secondary | ICD-10-CM | POA: Insufficient documentation

## 2017-11-25 DIAGNOSIS — R7989 Other specified abnormal findings of blood chemistry: Secondary | ICD-10-CM

## 2017-11-25 DIAGNOSIS — F4310 Post-traumatic stress disorder, unspecified: Secondary | ICD-10-CM | POA: Insufficient documentation

## 2017-11-25 DIAGNOSIS — R45851 Suicidal ideations: Secondary | ICD-10-CM | POA: Insufficient documentation

## 2017-11-25 LAB — COMPREHENSIVE METABOLIC PANEL
ALBUMIN: 4.8 g/dL (ref 3.5–5.0)
ALK PHOS: 88 U/L (ref 38–126)
ALT: 188 U/L — ABNORMAL HIGH (ref 17–63)
ANION GAP: 13 (ref 5–15)
AST: 789 U/L — ABNORMAL HIGH (ref 15–41)
BUN: 24 mg/dL — ABNORMAL HIGH (ref 6–20)
CALCIUM: 9.8 mg/dL (ref 8.9–10.3)
CO2: 23 mmol/L (ref 22–32)
Chloride: 103 mmol/L (ref 101–111)
Creatinine, Ser: 1.38 mg/dL — ABNORMAL HIGH (ref 0.61–1.24)
GFR calc Af Amer: >60 mL/min (ref >60)
GFR calc non Af Amer: >60 mL/min (ref >60)
GLUCOSE: 122 mg/dL — AB (ref 65–99)
POTASSIUM: 3.9 mmol/L (ref 3.5–5.1)
SODIUM: 139 mmol/L (ref 135–145)
Total Bilirubin: 1.9 mg/dL — ABNORMAL HIGH (ref 0.3–1.2)
Total Protein: 8.2 g/dL — ABNORMAL HIGH (ref 6.5–8.1)

## 2017-11-25 LAB — CBC
HEMATOCRIT: 48.4 % (ref 39.0–52.0)
HEMOGLOBIN: 16.9 g/dL (ref 13.0–17.0)
MCH: 30.6 pg (ref 26.0–34.0)
MCHC: 34.9 g/dL (ref 30.0–36.0)
MCV: 87.5 fL (ref 78.0–100.0)
Platelets: 479 10*3/uL — ABNORMAL HIGH (ref 150–400)
RBC: 5.53 MIL/uL (ref 4.22–5.81)
RDW: 14.3 % (ref 11.5–15.5)
WBC: 13.9 10*3/uL — ABNORMAL HIGH (ref 4.0–10.5)

## 2017-11-25 NOTE — ED Triage Notes (Addendum)
[facility], pt requesting resources for substance abuse, PTSD, and SI. Pt reports using crack cocaine, xanax, and drank vodka at 1900. Pt is A+OX4, cooperative, and appears anxious.

## 2017-11-25 NOTE — H&P (Signed)
Behavioral Health Medical Screening Exam  Brandon [name] is an 42 y.o. male [facility] for reasons of polysubstance abuse and MDD. He gives a hx of daily alcohol use, Crack cocaine use and benzodiazepines use. The patient is c/o of nausea, dry mouth, but no CP, SOB, or  AMS changes.   Total Time spent with patient: 20 minutes  Psychiatric Specialty Exam: Physical Exam  Constitutional: He is oriented to person, place, and time. He appears well-developed and well-nourished. No distress.  HENT:  Head: Normocephalic.  Eyes: Pupils are equal, round, and reactive to light.  Respiratory: Effort normal and breath sounds normal. No respiratory distress.  Neurological: He is alert and oriented to person, place, and time. No cranial nerve deficit.  Skin: Skin is warm and dry. He is not diaphoretic.  Psychiatric: His speech is normal. His mood appears anxious. He is withdrawn. Cognition and memory are impaired. He expresses impulsivity. He exhibits a depressed mood. He expresses no homicidal and no suicidal ideation. He expresses no suicidal plans and no homicidal plans.    Review of Systems  Constitutional: Positive for diaphoresis and malaise/fatigue. Negative for chills, fever and weight loss.  Respiratory: Negative for shortness of breath.   Cardiovascular: Negative for chest pain and palpitations.  Gastrointestinal: Positive for nausea. Negative for abdominal pain and diarrhea.  Psychiatric/Behavioral: Positive for depression and substance abuse. Negative for suicidal ideas. The patient is nervous/anxious and has insomnia.     There were no vitals taken for this visit.There is no height or weight on file to calculate BMI.  General Appearance: Disheveled  Eye Contact:  Fair  Speech:  Garbled  Volume:  Normal  Mood:  Depressed  Affect:  Congruent  Thought Process:  Goal Directed  Orientation:  Full (Time, Place, and Person)  Thought Content:  Logical  Suicidal Thoughts:  No   Homicidal Thoughts:  No  Memory:  Immediate;   Good  Judgement:  Impaired  Insight:  Lacking  Psychomotor Activity:  Normal  Concentration: Concentration: Poor  Recall:  Poor  Fund of Knowledge:Poor  Language: Good  Akathisia:  Negative  Handed:  Right  AIMS (if indicated):     Assets:  Desire for Improvement  Sleep:       Musculoskeletal: Strength & Muscle Tone: within normal limits Gait & Station: normal Patient leans: N/A  There were no vitals taken for this visit.  Recommendations:  Based on my evaluation the patient appears to have an emergency medical condition for which I recommend the patient be transferred to the emergency department for further evaluation.  Brandon HoughSpencer E Mikinzie Maciejewski, PA-C 11/25/2017, 9:03 PM

## 2017-11-26 ENCOUNTER — Emergency Department (HOSPITAL_COMMUNITY): Payer: Self-pay

## 2017-11-26 ENCOUNTER — Encounter (HOSPITAL_COMMUNITY): Payer: Self-pay | Admitting: *Deleted

## 2017-11-26 ENCOUNTER — Other Ambulatory Visit: Payer: Self-pay

## 2017-11-26 ENCOUNTER — Inpatient Hospital Stay (HOSPITAL_COMMUNITY)
Admission: AD | Admit: 2017-11-26 | Discharge: 2017-11-30 | DRG: 885 | Disposition: A | Discharge status: Home / Self Care | Payer: No Typology Code available for payment source | Source: Intra-hospital | Attending: Psychiatry | Admitting: Psychiatry

## 2017-11-26 DIAGNOSIS — F1410 Cocaine abuse, uncomplicated: Secondary | ICD-10-CM | POA: Diagnosis present

## 2017-11-26 DIAGNOSIS — F5105 Insomnia due to other mental disorder: Secondary | ICD-10-CM | POA: Diagnosis present

## 2017-11-26 DIAGNOSIS — Z818 Family history of other mental and behavioral disorders: Secondary | ICD-10-CM | POA: Diagnosis not present

## 2017-11-26 DIAGNOSIS — F10239 Alcohol dependence with withdrawal, unspecified: Secondary | ICD-10-CM | POA: Diagnosis not present

## 2017-11-26 DIAGNOSIS — F17210 Nicotine dependence, cigarettes, uncomplicated: Secondary | ICD-10-CM | POA: Diagnosis not present

## 2017-11-26 DIAGNOSIS — E039 Hypothyroidism, unspecified: Secondary | ICD-10-CM | POA: Diagnosis present

## 2017-11-26 DIAGNOSIS — R4587 Impulsiveness: Secondary | ICD-10-CM | POA: Diagnosis not present

## 2017-11-26 DIAGNOSIS — F4310 Post-traumatic stress disorder, unspecified: Secondary | ICD-10-CM | POA: Diagnosis present

## 2017-11-26 DIAGNOSIS — F332 Major depressive disorder, recurrent severe without psychotic features: Principal | ICD-10-CM | POA: Diagnosis present

## 2017-11-26 DIAGNOSIS — Z560 Unemployment, unspecified: Secondary | ICD-10-CM

## 2017-11-26 DIAGNOSIS — Y900 Blood alcohol level of less than 20 mg/100 ml: Secondary | ICD-10-CM | POA: Diagnosis present

## 2017-11-26 DIAGNOSIS — Z79890 Hormone replacement therapy (postmenopausal): Secondary | ICD-10-CM | POA: Diagnosis not present

## 2017-11-26 DIAGNOSIS — E785 Hyperlipidemia, unspecified: Secondary | ICD-10-CM | POA: Diagnosis present

## 2017-11-26 DIAGNOSIS — F329 Major depressive disorder, single episode, unspecified: Secondary | ICD-10-CM | POA: Diagnosis present

## 2017-11-26 DIAGNOSIS — F1310 Sedative, hypnotic or anxiolytic abuse, uncomplicated: Secondary | ICD-10-CM | POA: Diagnosis not present

## 2017-11-26 DIAGNOSIS — Z590 Homelessness: Secondary | ICD-10-CM | POA: Diagnosis not present

## 2017-11-26 DIAGNOSIS — F1490 Cocaine use, unspecified, uncomplicated: Secondary | ICD-10-CM | POA: Diagnosis not present

## 2017-11-26 LAB — COMPREHENSIVE METABOLIC PANEL
ALBUMIN: 4.1 g/dL (ref 3.5–5.0)
ALT: 160 U/L — AB (ref 17–63)
AST: 614 U/L — AB (ref 15–41)
Alkaline Phosphatase: 77 U/L (ref 38–126)
Anion gap: 10 (ref 5–15)
BUN: 33 mg/dL — AB (ref 6–20)
CHLORIDE: 101 mmol/L (ref 101–111)
CO2: 26 mmol/L (ref 22–32)
CREATININE: 1.65 mg/dL — AB (ref 0.61–1.24)
Calcium: 9.1 mg/dL (ref 8.9–10.3)
GFR calc Af Amer: 59 mL/min — ABNORMAL LOW (ref >60)
GFR calc non Af Amer: 51 mL/min — ABNORMAL LOW (ref >60)
Glucose, Bld: 106 mg/dL — ABNORMAL HIGH (ref 65–99)
POTASSIUM: 3.9 mmol/L (ref 3.5–5.1)
SODIUM: 137 mmol/L (ref 135–145)
Total Bilirubin: 1.4 mg/dL — ABNORMAL HIGH (ref 0.3–1.2)
Total Protein: 6.9 g/dL (ref 6.5–8.1)

## 2017-11-26 LAB — URINALYSIS, ROUTINE W REFLEX MICROSCOPIC
Bilirubin Urine: NEGATIVE
Glucose, UA: NEGATIVE mg/dL
KETONES UR: 5 mg/dL — AB
Leukocytes, UA: NEGATIVE
Nitrite: NEGATIVE
PH: 5 (ref 5.0–8.0)
PROTEIN: 100 mg/dL — AB
Specific Gravity, Urine: 1.032 — ABNORMAL HIGH (ref 1.005–1.030)

## 2017-11-26 LAB — RAPID URINE DRUG SCREEN, HOSP PERFORMED
Amphetamines: NOT DETECTED
Barbiturates: NOT DETECTED
Benzodiazepines: POSITIVE — AB
Cocaine: POSITIVE — AB
OPIATES: NOT DETECTED
Tetrahydrocannabinol: NOT DETECTED

## 2017-11-26 LAB — PROTIME-INR
INR: 1
Prothrombin Time: 13.1 seconds (ref 11.4–15.2)

## 2017-11-26 LAB — ACETAMINOPHEN LEVEL: Acetaminophen (Tylenol), Serum: <10 ug/mL — ABNORMAL LOW (ref 10–30)

## 2017-11-26 LAB — SALICYLATE LEVEL: Salicylate Lvl: <7 mg/dL (ref 2.8–30.0)

## 2017-11-26 LAB — LIPASE, BLOOD: Lipase: 27 U/L (ref 11–51)

## 2017-11-26 LAB — ETHANOL

## 2017-11-26 MED ORDER — ONDANSETRON HCL 4 MG PO TABS: 4.0000 mg | ORAL_TABLET | Freq: Three times a day (TID) | ORAL | Status: DC | PRN

## 2017-11-26 MED ORDER — IBUPROFEN 800 MG PO TABS: 800.0000 mg | ORAL_TABLET | Freq: Three times a day (TID) | ORAL | Status: DC | PRN

## 2017-11-26 MED ORDER — HYDROXYZINE HCL 25 MG PO TABS
25.0000 mg | ORAL_TABLET | Freq: Three times a day (TID) | ORAL | Status: DC | PRN
  Administered 2017-11-26 – 2017-11-29 (×3): 25 mg via ORAL
  Filled 2017-11-26: qty 1
  Filled 2017-11-26: qty 10
  Filled 2017-11-26 (×2): qty 1

## 2017-11-26 MED ORDER — LORAZEPAM 2 MG/ML IJ SOLN: 0.0000 mg | Freq: Four times a day (QID) | INTRAMUSCULAR | Status: DC

## 2017-11-26 MED ORDER — LORAZEPAM 2 MG/ML IJ SOLN: 0.0000 mg | Freq: Two times a day (BID) | INTRAMUSCULAR | Status: DC

## 2017-11-26 MED ORDER — LEVOTHYROXINE SODIUM 88 MCG PO TABS
88.0000 ug | ORAL_TABLET | Freq: Every day | ORAL | Status: DC
  Administered 2017-11-27 – 2017-11-30 (×4): 88 ug via ORAL
  Filled 2017-11-26 (×5): qty 1

## 2017-11-26 MED ORDER — VITAMIN B-1 100 MG PO TABS
100.0000 mg | ORAL_TABLET | Freq: Every day | ORAL | Status: DC
  Administered 2017-11-27 – 2017-11-30 (×4): 100 mg via ORAL
  Filled 2017-11-26 (×7): qty 1

## 2017-11-26 MED ORDER — ONDANSETRON HCL 4 MG PO TABS
4.0000 mg | ORAL_TABLET | Freq: Three times a day (TID) | ORAL | Status: DC | PRN
  Administered 2017-11-26: 4 mg via ORAL
  Filled 2017-11-26: qty 1

## 2017-11-26 MED ORDER — LORAZEPAM 1 MG PO TABS
0.0000 mg | ORAL_TABLET | Freq: Four times a day (QID) | ORAL | Status: DC
  Administered 2017-11-26: 2 mg via ORAL
  Administered 2017-11-26 (×3): 1 mg via ORAL
  Filled 2017-11-26 (×3): qty 1
  Filled 2017-11-26: qty 2

## 2017-11-26 MED ORDER — NICOTINE 21 MG/24HR TD PT24
21.0000 mg | MEDICATED_PATCH | Freq: Every day | TRANSDERMAL | Status: DC
  Administered 2017-11-27 – 2017-11-30 (×4): 21 mg via TRANSDERMAL
  Filled 2017-11-26 (×6): qty 1

## 2017-11-26 MED ORDER — GI COCKTAIL ~~LOC~~
30.0000 mL | Freq: Three times a day (TID) | ORAL | Status: DC | PRN
  Administered 2017-11-29: 30 mL via ORAL
  Filled 2017-11-26 (×2): qty 30

## 2017-11-26 MED ORDER — LIDOCAINE 5 % EX PTCH
1.0000 | MEDICATED_PATCH | Freq: Every day | CUTANEOUS | Status: DC
  Administered 2017-11-26 – 2017-11-28 (×3): 1 via TRANSDERMAL
  Filled 2017-11-26 (×6): qty 1

## 2017-11-26 MED ORDER — PRAZOSIN HCL 1 MG PO CAPS
3.0000 mg | ORAL_CAPSULE | Freq: Every day | ORAL | Status: DC
  Administered 2017-11-27 – 2017-11-28 (×2): 3 mg via ORAL
  Filled 2017-11-26 (×3): qty 3

## 2017-11-26 MED ORDER — THIAMINE HCL 100 MG/ML IJ SOLN: 100.0000 mg | Freq: Every day | INTRAMUSCULAR | Status: DC

## 2017-11-26 MED ORDER — GI COCKTAIL ~~LOC~~
30.0000 mL | Freq: Three times a day (TID) | ORAL | Status: DC | PRN
  Filled 2017-11-26: qty 30

## 2017-11-26 MED ORDER — PRAZOSIN HCL 1 MG PO CAPS
3.0000 mg | ORAL_CAPSULE | Freq: Every day | ORAL | Status: DC
  Administered 2017-11-26: 3 mg via ORAL
  Filled 2017-11-26: qty 3

## 2017-11-26 MED ORDER — LORAZEPAM 1 MG PO TABS: 0.0000 mg | ORAL_TABLET | Freq: Two times a day (BID) | ORAL | Status: DC

## 2017-11-26 MED ORDER — TRAZODONE HCL 100 MG PO TABS: 300.0000 mg | ORAL_TABLET | Freq: Every day | ORAL | Status: DC

## 2017-11-26 MED ORDER — METHOCARBAMOL 500 MG PO TABS
500.0000 mg | ORAL_TABLET | Freq: Three times a day (TID) | ORAL | Status: DC | PRN
  Administered 2017-11-26 – 2017-11-30 (×8): 500 mg via ORAL
  Filled 2017-11-26 (×10): qty 1

## 2017-11-26 MED ORDER — GI COCKTAIL ~~LOC~~
30.0000 mL | Freq: Once | ORAL | Status: AC
  Administered 2017-11-26: 30 mL via ORAL
  Filled 2017-11-26 (×2): qty 30

## 2017-11-26 MED ORDER — LORAZEPAM 1 MG PO TABS
0.0000 mg | ORAL_TABLET | Freq: Four times a day (QID) | ORAL | Status: DC
  Administered 2017-11-26 – 2017-11-27 (×2): 2 mg via ORAL
  Administered 2017-11-27 (×2): 1 mg via ORAL
  Filled 2017-11-26: qty 2
  Filled 2017-11-26 (×2): qty 1
  Filled 2017-11-26: qty 2

## 2017-11-26 MED ORDER — TRAZODONE HCL 50 MG PO TABS: 50.0000 mg | ORAL_TABLET | Freq: Every evening | ORAL | Status: DC | PRN

## 2017-11-26 MED ORDER — BUSPIRONE HCL 10 MG PO TABS
10.0000 mg | ORAL_TABLET | Freq: Two times a day (BID) | ORAL | Status: DC
  Administered 2017-11-27 – 2017-11-30 (×7): 10 mg via ORAL
  Filled 2017-11-26 (×8): qty 1
  Filled 2017-11-26: qty 2
  Filled 2017-11-26: qty 1

## 2017-11-26 MED ORDER — MAGNESIUM HYDROXIDE 400 MG/5ML PO SUSP: 30.0000 mL | Freq: Every day | ORAL | Status: DC | PRN

## 2017-11-26 MED ORDER — SODIUM CHLORIDE 0.9 % IV BOLUS: 1000.0000 mL | Freq: Once | INTRAVENOUS | Status: DC

## 2017-11-26 MED ORDER — BUSPIRONE HCL 10 MG PO TABS
10.0000 mg | ORAL_TABLET | Freq: Two times a day (BID) | ORAL | Status: DC
  Administered 2017-11-26 (×2): 10 mg via ORAL
  Filled 2017-11-26 (×2): qty 1

## 2017-11-26 MED ORDER — VITAMIN B-1 100 MG PO TABS
100.0000 mg | ORAL_TABLET | Freq: Every day | ORAL | Status: DC
  Administered 2017-11-26: 100 mg via ORAL
  Filled 2017-11-26: qty 1

## 2017-11-26 MED ORDER — IBUPROFEN 800 MG PO TABS
800.0000 mg | ORAL_TABLET | Freq: Three times a day (TID) | ORAL | Status: DC | PRN
Start: 2017-11-26 — End: 2017-11-30
  Administered 2017-11-26 – 2017-11-28 (×4): 800 mg via ORAL
  Filled 2017-11-26 (×4): qty 1

## 2017-11-26 MED ORDER — GI COCKTAIL ~~LOC~~
30.0000 mL | Freq: Once | ORAL | Status: AC
  Administered 2017-11-26: 30 mL via ORAL
  Filled 2017-11-26: qty 30

## 2017-11-26 MED ORDER — FAMOTIDINE 20 MG PO TABS
20.0000 mg | ORAL_TABLET | Freq: Two times a day (BID) | ORAL | Status: DC
  Administered 2017-11-26 (×3): 20 mg via ORAL
  Filled 2017-11-26 (×3): qty 1

## 2017-11-26 MED ORDER — HALOPERIDOL 5 MG PO TABS
5.0000 mg | ORAL_TABLET | Freq: Four times a day (QID) | ORAL | Status: DC | PRN
  Administered 2017-11-26 – 2017-11-28 (×3): 5 mg via ORAL
  Filled 2017-11-26 (×3): qty 1

## 2017-11-26 MED ORDER — NICOTINE 21 MG/24HR TD PT24
21.0000 mg | MEDICATED_PATCH | Freq: Every day | TRANSDERMAL | Status: DC
  Administered 2017-11-26: 21 mg via TRANSDERMAL
  Filled 2017-11-26: qty 1

## 2017-11-26 MED ORDER — LIDOCAINE 5 % EX PTCH
1.0000 | MEDICATED_PATCH | CUTANEOUS | Status: DC
  Administered 2017-11-26: 1 via TRANSDERMAL
  Filled 2017-11-26 (×3): qty 1

## 2017-11-26 MED ORDER — BENZTROPINE MESYLATE 1 MG PO TABS
1.0000 mg | ORAL_TABLET | Freq: Four times a day (QID) | ORAL | Status: DC | PRN
  Administered 2017-11-26 – 2017-11-28 (×2): 1 mg via ORAL
  Filled 2017-11-26 (×2): qty 1

## 2017-11-26 MED ORDER — METHOCARBAMOL 500 MG PO TABS
500.0000 mg | ORAL_TABLET | Freq: Three times a day (TID) | ORAL | Status: DC | PRN
  Administered 2017-11-26 (×2): 500 mg via ORAL
  Filled 2017-11-26 (×2): qty 1

## 2017-11-26 MED ORDER — TRAZODONE HCL 150 MG PO TABS
300.0000 mg | ORAL_TABLET | Freq: Every day | ORAL | Status: DC
  Administered 2017-11-26 – 2017-11-29 (×4): 300 mg via ORAL
  Filled 2017-11-26 (×5): qty 2

## 2017-11-26 MED ORDER — FAMOTIDINE 20 MG PO TABS
20.0000 mg | ORAL_TABLET | Freq: Two times a day (BID) | ORAL | Status: DC
  Administered 2017-11-27 – 2017-11-28 (×3): 20 mg via ORAL
  Filled 2017-11-26 (×6): qty 1

## 2017-11-26 MED ORDER — ALUM & MAG HYDROXIDE-SIMETH 200-200-20 MG/5ML PO SUSP
30.0000 mL | ORAL | Status: DC | PRN
  Administered 2017-11-28 (×2): 30 mL via ORAL
  Filled 2017-11-26 (×2): qty 30

## 2017-11-26 MED ORDER — ACETAMINOPHEN 325 MG PO TABS: 650.0000 mg | ORAL_TABLET | Freq: Four times a day (QID) | ORAL | Status: DC | PRN

## 2017-11-26 MED ORDER — LEVOTHYROXINE SODIUM 88 MCG PO TABS
88.0000 ug | ORAL_TABLET | Freq: Every day | ORAL | Status: DC
  Administered 2017-11-26: 88 ug via ORAL
  Filled 2017-11-26 (×2): qty 1

## 2017-11-26 MED ORDER — KETOROLAC TROMETHAMINE 60 MG/2ML IM SOLN
60.0000 mg | Freq: Once | INTRAMUSCULAR | Status: AC
  Administered 2017-11-26: 60 mg via INTRAMUSCULAR
  Filled 2017-11-26: qty 2

## 2017-11-26 NOTE — ED Notes (Signed)
Patient is medically clear for psych per Dr Silverio LayYao

## 2017-11-26 NOTE — BH Assessment (Signed)
[facility]  Per Juanetta BeetsJacqueline Norman, DO, this pt requires psychiatric hospitalization at this time.  Malva LimesLinsey Strader, RN, [facility] Rm [id number]; [facility] will be ready to receive pt at 21:30.  Pt has signed Voluntary Admission and Consent for Treatment, as well as Consent to Release Information to [facility] [place], and a notification call has been placed.  Signed forms have [facility] fax[facility].  Pt's nurse, Wille CelesteJanie, has been notified, and agrees to send original paperwork along with pt via [place] [place], and to call report to [phone].  Doylene Canninghomas Elmer [place], [place] Behavioral Health Coordinator [phone]

## 2017-11-26 NOTE — Tx Team (Signed)
Initial Treatment Plan 11/26/2017 10:21 PM Brandon [name] [id number]    PATIENT STRESSORS: Medication change or noncompliance Substance abuse   PATIENT STRENGTHS: Ability for insight Average or above average intelligence Capable of independent living General fund of knowledge Motivation for treatment/growth   PATIENT IDENTIFIED PROBLEMS: Depression Substance Abuse Suicidal thoughts "Get stabilized on my medications"                     DISCHARGE CRITERIA:  Ability to meet basic life and health needs Improved stabilization in mood, thinking, and/or behavior Verbal commitment to aftercare and medication compliance Withdrawal symptoms are absent or subacute and managed without 24-hour nursing intervention  PRELIMINARY DISCHARGE PLAN: Attend aftercare/continuing care group Return to previous living arrangement  PATIENT/FAMILY INVOLVEMENT: This treatment plan has been presented to and reviewed with the patient, Brandon [name], and/or family member, .  The patient and family have been given the opportunity to ask questions and make suggestions.  Etoy Mcdonnell, [place], [place] 11/26/2017, 10:21 PM

## 2017-11-26 NOTE — ED Notes (Signed)
Back from US, ambulatory w/o difficulty

## 2017-11-26 NOTE — Progress Notes (Signed)
Brandon PhillipsKori is a 42 year old male pt admitted on voluntary basis. He reports that he recently relapsed on crack, benzo's and vodka and reports his last usage was yesterday. He denies any current SI and is able to contract for safety while in the hospital. He reports that he has been off his medications for a few days now and reports that he needs to get stabilized back on them as he reports that he does well when he is on them. He [place] that [place] was living in [place] and reports that he plans to go back there once he is discharged. Brandon [name] was oriented to the unit and safety maintained.

## 2017-11-26 NOTE — ED Notes (Signed)
Dr Sharma CovertNorman and Jacki ConesLaurie NP into see.  Pt reports that he [place] [facility] and was doing well.  Pt reports that he came back here 3 days ago to get his car and relapsed.  Pt reports that he has been using crack, vodka and xanax for the past 3 days, and has not take any of his regular medications x3 days either.  Pt denies hi/avh at this time, but reports continuing suicidal thoughts.  Pt reports that the thoughts are not as bad today, but does not feel safe leaving.  Pt reports previous hx of SA by od.

## 2017-11-26 NOTE — BH Assessment (Addendum)
Assessment Note  Brandon [name] is an 42 y.o. male. The pt came in due to having suicidal thoughts and saying he wants to get clean from drugs.  The pt denies having a suicidal plan at this time.  He reported he is using cocaine and alcohol.  He stated he had a fifth of alcohol around 630pm and also had some cocaine.  The pt's blood alcohol level is 0.000 when it was checked at 2248.  The pt has not done his UDS at this time.  He also stated he has a history of using crystal meth, but has not used any crystal meth in about two weeks.  When asked where he was staying, the pt stated he was staying at [facility] drug rehab facility in [place], [place].  The pt could not state why he was in [place].  The pt was previously homeless.  The pt was last hospitalized February 2019 for SI and substance use.  He has also had previous hospitalizations for SI and SA.  The pt denies HI, legal issues and hallucinations.  When asked about his sleep the pt stated he gets "zero hours"  of sleep.  He also stated he is not eating well.  During the assessment the pt was drowsy and repeatedly had to be awaken.   Diagnosis: F33.2 Major depressive disorder, Recurrent episode, Severe F14.20 Cocaine use disorder, Moderate F10.20 Alcohol use disorder, Severe   Past Medical History:  Past Medical History:  Diagnosis Date  . Anxiety   . Depression   . Hyperlipidemia   . Substance abuse (HCC)   . Thyroid disease     History reviewed. No pertinent surgical history.  Family History:  Family History  Problem Relation Age of Onset  . Diabetes Father     Social History:  reports that he has been smoking cigarettes.  He has never used smokeless tobacco. He reports that he drinks alcohol. He reports that he has current or past drug history. Drug: "Crack" cocaine.  Additional Social History:  Alcohol / Drug Use Pain Medications: See MAR Prescriptions: See MAR Over the Counter: See MAR History of alcohol / drug  use?: Yes Longest period of sobriety (when/how long): unknown Substance #1 Name of Substance 1: cocaine 1 - Amount (size/oz): unknown 1 - Frequency: daily 1 - Last Use / Amount: 11/25/2017 Substance #2 Name of Substance 2: alcohol 2 - Amount (size/oz): fifth 2 - Frequency: daily 2 - Last Use / Amount: 11/25/2017 Substance #3 Name of Substance 3: crystal meth 3 - Amount (size/oz): unknown 3 - Frequency: unable to assess 3 - Last Use / Amount: 11/17/2017  CIWA: CIWA-Ar BP: (!) 131/91 Pulse Rate: (!) 118 COWS:    Allergies: No Known Allergies  Home Medications:  (Not in a hospital admission)  OB/GYN Status:  No LMP for male patient.  General Assessment Data Location of Assessment: [facility] TTS Assessment: In system Is this a Tele or Face-to-Face Assessment?: Face-to-Face Is this an Initial Assessment or a Re-assessment for this encounter?: Initial Assessment Marital status: Single Maiden name: NA Is patient pregnant?: Other (Comment)(male) Living Arrangements: Alone, Other (Comment)(homeless) Can pt return to current living arrangement?: Yes Admission Status: Voluntary Is patient capable of signing voluntary admission?: Yes Referral Source: Self/Family/Friend Insurance type: Self Pay  Medical Screening Exam [facility] Walk-in ONLY) Medical Exam completed: Yes  Crisis Care Plan Living Arrangements: Alone, Other (Comment)(homeless) Legal Guardian: Other:(Self) Name of Psychiatrist: [facility] Name of Therapist: [facility]  Education  Status Is patient currently in school?: No  Risk to self with the past 6 months Suicidal Ideation: Yes-Currently Present Has patient been a risk to self within the past 6 months prior to admission? : Yes Suicidal Intent: No Has patient had any suicidal intent within the past 6 months prior to admission? : Yes Is patient at risk for suicide?: Yes Suicidal Plan?: No Has patient had any suicidal plan within the past 6 months  prior to admission? : No Access to Means: No What has been your use of drugs/alcohol within the last 12 months?: pt reported daily cocaine and alcohol use Previous Attempts/Gestures: Yes How many times?: 2 Other Self Harm Risks: drug use Triggers for Past Attempts: Unpredictable Intentional Self Injurious Behavior: None Comment - Self Injurious Behavior: NA Family Suicide History: No Recent stressful life event(s): Other (Comment)(homeless) Persecutory voices/beliefs?: No Depression: Yes Depression Symptoms: Insomnia, Loss of interest in usual pleasures Substance abuse history and/or treatment for substance abuse?: Yes Suicide prevention information given to non-admitted patients: Not applicable  Risk to Others within the past 6 months Homicidal Ideation: No Does patient have any lifetime risk of violence toward others beyond the six months prior to admission? : No Thoughts of Harm to Others: No Current Homicidal Intent: No Current Homicidal Plan: No Access to Homicidal Means: No Identified Victim: none History of harm to others?: No Assessment of Violence: None Noted Violent Behavior Description: none Does patient have access to weapons?: No Criminal Charges Pending?: No Does patient have a court date: No Is patient on probation?: No  Psychosis Hallucinations: None noted Delusions: None noted  Mental Status Report Appearance/Hygiene: In scrubs Eye Contact: Poor Motor Activity: Unable to assess Speech: Soft, Logical/coherent Level of Consciousness: Drowsy Mood: Other (Comment)(drowsy) Affect: Depressed, Flat Anxiety Level: None Thought Processes: Coherent, Relevant Judgement: Impaired Orientation: Person, Place, Time, Situation, Appropriate for developmental age Obsessive Compulsive Thoughts/Behaviors: None  Cognitive Functioning Concentration: Normal Memory: Recent Intact, Remote Intact Is patient IDD: No Is patient DD?: No Insight: Poor Impulse Control:  Poor Appetite: Fair Have you had any weight changes? : No Change Sleep: Decreased Total Hours of Sleep: 0 Vegetative Symptoms: None  ADLScreening [facility] Assessment Services) Patient's cognitive ability adequate to safely complete daily activities?: Yes Patient able to express need for assistance with ADLs?: Yes Independently performs ADLs?: Yes (appropriate for developmental age)  Prior Inpatient Therapy Prior Inpatient Therapy: Yes Prior Therapy Dates: 04/2015, 01/2016, 08/2016, 09/2017 Prior Therapy Facilty/Provider(s): [facility] [place] [facility] Reason for Treatment: depression and drug use  Prior Outpatient Therapy Prior Outpatient Therapy: Yes Prior Therapy Dates: Current Prior Therapy Facilty/Provider(s): [facility] Reason for Treatment: Depression Does patient have an ACCT team?: No Does patient have Intensive In-House Services?  : No Does patient have [facility] services? : No Does patient have P4CC services?: No  ADL Screening (condition at time of admission) Patient's cognitive ability adequate to safely complete daily activities?: Yes Patient able to express need for assistance with ADLs?: Yes Independently performs ADLs?: Yes (appropriate for developmental age)       Abuse/Neglect Assessment (Assessment to be complete while patient is alone) Abuse/Neglect Assessment Can Be Completed: Yes Physical Abuse: Denies Verbal Abuse: Denies Sexual Abuse: Denies Exploitation of patient/patient's resources: Denies Self-Neglect: Denies Values / Beliefs Cultural Requests During Hospitalization: None Spiritual Requests During Hospitalization: None Consults Spiritual Care Consult Needed: No Social Work Consult Needed: No            Disposition:  Disposition Initial Assessment  Completed for this Encounter: Yes   PA Brandon [name] recommends the pt be observed overnight for safety and stabilization then reassessed in the AM.  Brandon [name] was made aware of  the recommendations.  On Site Evaluation by:   Reviewed with Physician:    Brandon [name], Brandon [name] 11/26/2017 4:48 AM

## 2017-11-26 NOTE — ED Notes (Signed)
PO fluids continued to be encouraged

## 2017-11-26 NOTE — ED Notes (Signed)
Patient denies SI/HI/AVH at this time. Plan of care discussed. Encouragement and support provided and safety maintain. Q 15 min safety checks in place and video monitoring. 

## 2017-11-26 NOTE — ED Notes (Signed)
On the phone 

## 2017-11-26 NOTE — ED Notes (Signed)
Patient has been calm and cooperative during shift.

## 2017-11-26 NOTE — ED Notes (Signed)
Unable to collect urine sample patient states he is unable to go at this time

## 2017-11-26 NOTE — ED Notes (Signed)
C/o continued indigestion not relieved with pepcid Jacki ConesLaurie NP updated may repeat GI cocktail VORB

## 2017-11-26 NOTE — ED Notes (Signed)
Lab contacted for blood draw.

## 2017-11-26 NOTE — Progress Notes (Signed)
D   Pt is depressed and sad   He has multiple somatic complaints and wanted RN to bring his medications to his room because he didn't feel like coming to the medication window   He requested as much medication as he could get  A   Verbal support given   Medication administered and effectiveness monitored    Q 15 min checks  R   Pt is safe at present time

## 2017-11-26 NOTE — ED Notes (Addendum)
Dr Silverio Layyao into see

## 2017-11-26 NOTE — ED Notes (Signed)
Pt transported to [facility] by [facility] transportation service for continuation of specialized care. Belongings given to driver after patient signed for them. Pt left in no acute distress. 

## 2017-11-26 NOTE — ED Notes (Signed)
Pt ambulatory to room 29 with mHt for [place]

## 2017-11-26 NOTE — ED Provider Notes (Signed)
  Physical Exam  BP 122/78   Pulse (!) 103   Temp 97.6 F (36.4 C)   Resp 20   SpO2 99%   Physical Exam  ED Course/Procedures     Procedures  MDM  Patient here with suicidal ideation and alcohol abuse, benzo and cocaine abuse. LFTs elevated initially with AST 789, ALT 188. This morning, has decreased appetite but no vomiting. Repeat LFTs showed AST down to 614, ALT 160. Cr did increase to 1.6. I called poison control regarding patient. They states that patient is cleared from poison control perspective. Even if he had tylenol overdose (which he denies and initial tylenol level completely normal), they would have stopped mucomyst. Since his AG is normal, unlikely to have toxic alcohol ingestion.   2:37 PM RUQ US showed fatty liver and mild gallbladder thickening. There is no gallstones or sonographic murphy's. He is tolerating PO well. I think at this time, he is medically cleared. [facility]       Charlynne PanderYao, Srihitha Tagliaferri Hsienta, MD 11/26/17 [phone]

## 2017-11-26 NOTE — ED Notes (Signed)
Patient continues to endorse SI and deny HI and AVH at this time. Plan of care discussed. Encouragement and support provided and safety maintain. Q 15 min safety check remain in place and video monitoring.

## 2017-11-26 NOTE — ED Provider Notes (Signed)
[facility]-EMERGENCY DEPT Provider Note   CSN: [id number] Arrival date & time: 11/25/17  2104     History   Chief Complaint Chief Complaint  Patient presents with  . Medical Clearance    HPI Brandon [name] is a 42 y.o. male.  42 year old male with a history of anxiety, depression, thyroid disease, and polysubstance abuse presents to the emergency department requesting assistance with PTSD and suicidal ideations.  He has been binging on 1/5 of vodka daily as well as smoking crack cocaine over the past few days.  He has been noncompliant with his daily medications and also reports abusing Xanax.  Reports last ingestion at 1700 today.  He is further complaining of low back pain.  This is aggravated with ambulation.  He has not had any bowel or bladder incontinence or associated fevers.  No lower extremity weakness, history of IV drug use.  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  The history is provided by the patient. No language interpreter was used.    Past Medical History:  Diagnosis Date  . Anxiety   . Depression   . Hyperlipidemia   . Substance abuse (HCC)   . Thyroid disease     Patient Active Problem List   Diagnosis Date Noted  . PTSD (post-traumatic stress disorder) 10/15/2017  . Bipolar I disorder, current or most recent episode depressed, with psychotic features (HCC) 10/14/2017    History reviewed. No pertinent surgical history.      Home Medications    Prior to Admission medications   Medication Sig Start Date End Date Taking? Authorizing Provider  ARIPiprazole (ABILIFY) 5 MG tablet Take 5 mg by mouth daily.   Yes [provider]  busPIRone (BUSPAR) 10 MG tablet Take 10 mg by mouth 2 (two) times daily.   Yes [provider]  DULoxetine (CYMBALTA) 30 MG capsule Take 1 capsule (30 mg total) by mouth daily. For depression 10/22/17  Yes Armandina Stammer I, NP  levothyroxine  (SYNTHROID, LEVOTHROID) 88 MCG tablet Take 1 tablet (88 mcg total) by mouth daily before breakfast. For thyroid hormone replace 10/22/17  Yes Nwoko, Nicole Kindred I, NP  omeprazole (PRILOSEC) 20 MG capsule Take 1 capsule (20 mg total) by mouth daily. For acid reflux Patient taking differently: Take 40 mg by mouth daily. For acid reflux 10/21/17  Yes Nwoko, Nicole Kindred I, NP  prazosin (MINIPRESS) 1 MG capsule Take 3 capsules (3 mg total) by mouth at bedtime. For nightmares Patient taking differently: Take 5 mg by mouth at bedtime. For nightmares 10/21/17  Yes Armandina Stammer I, NP  traZODone (DESYREL) 300 MG tablet Take 1 tablet (300 mg total) by mouth at bedtime. For sleep 10/21/17  Yes Armandina Stammer I, NP  hydrOXYzine (ATARAX/VISTARIL) 50 MG tablet Take 1 tablet (50 mg total) by mouth every 6 (six) hours as needed for anxiety. Patient not taking: Reported on 11/25/2017 10/21/17   Armandina Stammer I, NP  nicotine (NICODERM CQ - DOSED IN MG/24 HOURS) 21 mg/24hr patch Place 1 patch (21 mg total) onto the skin daily. (May buy from over the counter): For smoking cessation Patient not taking: Reported on 11/25/2017 10/22/17   Armandina Stammer I, NP  OLANZapine (ZYPREXA) 10 MG tablet Take 1 tablet (10 mg total) by mouth at bedtime. For mood control Patient not taking: Reported on 11/25/2017 10/21/17   Armandina Stammer I, NP  ondansetron (ZOFRAN) 4 MG tablet Take 1 tablet (4 mg  total) by mouth every 6 (six) hours. For nausea Patient not taking: Reported on 11/25/2017 10/21/17   Sanjuana Kava, NP    Family History Family History  Problem Relation Age of Onset  . Diabetes Father     Social History Social History   Tobacco Use  . Smoking status: Current Every Day Smoker    Types: Cigarettes  . Smokeless tobacco: Never Used  Substance Use Topics  . Alcohol use: Yes  . Drug use: Yes    Types: "Crack" cocaine     Allergies   Patient has no known allergies.   Review of Systems Review of Systems Ten systems reviewed and are negative for  acute change, except as noted in the HPI.    Physical Exam Updated Vital Signs BP (!) 131/91 (BP Location: Right Arm)   Pulse (!) 118   Temp 98.4 F (36.9 C) (Oral)   Resp 20   SpO2 94%   Physical Exam  Constitutional: He is oriented to person, place, and time. He appears well-developed and well-nourished. No distress.  HENT:  Head: Normocephalic and atraumatic.  Eyes: Conjunctivae and EOM are normal. No scleral icterus.  Neck: Normal range of motion.  Cardiovascular: Regular rhythm and intact distal pulses.  Tachycardia  Pulmonary/Chest: Effort normal. No stridor. No respiratory distress. He has no wheezes.  Respirations even and unlabored  Musculoskeletal: Normal range of motion.  Neurological: He is alert and oriented to person, place, and time. He exhibits normal muscle tone. Coordination normal.  GCS 15. Ambulatory with antalgic gait.  Skin: Skin is warm and dry. No rash noted. He is not diaphoretic. No erythema. No pallor.  Psychiatric: His speech is normal. His mood appears anxious. He is agitated (mild). He expresses suicidal ideation. He expresses no homicidal ideation. He expresses no homicidal plans.  Nursing note and vitals reviewed.    ED Treatments / Results  Labs (all labs ordered are listed, but only abnormal results are displayed) Labs Reviewed  COMPREHENSIVE METABOLIC PANEL - Abnormal; Notable for the following components:      Result Value   Glucose, Bld 122 (*)    BUN 24 (*)    Creatinine, Ser 1.38 (*)    Total Protein 8.2 (*)    AST 789 (*)    ALT 188 (*)    Total Bilirubin 1.9 (*)    All other components within normal limits  CBC - Abnormal; Notable for the following components:   WBC 13.9 (*)    Platelets 479 (*)    All other components within normal limits  ACETAMINOPHEN LEVEL - Abnormal; Notable for the following components:   Acetaminophen (Tylenol), Serum <10 (*)    All other components within normal limits  ETHANOL  SALICYLATE LEVEL    RAPID URINE DRUG SCREEN, HOSP PERFORMED  HEPATITIS PANEL, ACUTE  URINALYSIS, ROUTINE W REFLEX MICROSCOPIC    EKG None  Radiology No results found.  Procedures Procedures (including critical care time)  Medications Ordered in ED Medications  LORazepam (ATIVAN) injection 0-4 mg ( Intravenous See Alternative 11/26/17 0034)    Or  LORazepam (ATIVAN) tablet 0-4 mg (2 mg Oral Given 11/26/17 0034)  LORazepam (ATIVAN) injection 0-4 mg (has no administration in time range)    Or  LORazepam (ATIVAN) tablet 0-4 mg (has no administration in time range)  thiamine (VITAMIN B-1) tablet 100 mg (has no administration in time range)    Or  thiamine (B-1) injection 100 mg (has no administration in time range)  nicotine (NICODERM CQ - dosed in mg/24 hours) patch 21 mg (has no administration in time range)  ondansetron (ZOFRAN) tablet 4 mg (4 mg Oral Given 11/26/17 0034)  famotidine (PEPCID) tablet 20 mg (20 mg Oral Given 11/26/17 0131)  lidocaine (LIDODERM) 5 % 1 patch (1 patch Transdermal Patch Applied 11/26/17 0041)  levothyroxine (SYNTHROID, LEVOTHROID) tablet 88 mcg (has no administration in time range)  busPIRone (BUSPAR) tablet 10 mg (has no administration in time range)  prazosin (MINIPRESS) capsule 3 mg (has no administration in time range)  traZODone (DESYREL) tablet 300 mg (has no administration in time range)  gi cocktail (Maalox,Lidocaine,Donnatal) (30 mLs Oral Given 11/26/17 0041)  ketorolac (TORADOL) injection 60 mg (60 mg Intramuscular Given 11/26/17 0034)     Initial Impression / Assessment and Plan / ED Course  I have reviewed the triage vital signs and the nursing notes.  Pertinent labs & imaging results that were available during my care of the patient were reviewed by me and considered in my medical decision making (see chart for details).     42 year old male with a history of polysubstance abuse, PTSD, depression presents to the emergency department for psychiatric  evaluation.  I have discussed the patient's elevated LFTs today.  I suspect this is from ongoing crack cocaine use and heavy alcohol abuse.  I have added a hepatitis panel.   He was initially tachycardic on arrival with slightly elevated blood pressure.  This may be from alcohol withdrawal as ethanol less than 10.  He has been placed on CIWA protocol.  Leukocytosis today is consistent with prior visits.  Patient medically cleared.  Patient evaluated by psychiatry who recommend additional evaluation later this morning.  Patient presenting voluntarily.  Disposition to be determined by oncoming ED provider.   Final Clinical Impressions(s) / ED Diagnoses   Final diagnoses:  Severe episode of recurrent major depressive disorder, without psychotic features (HCC)  Polysubstance abuse (HCC)  Elevated liver function tests    ED Discharge Orders    None       Antony MaduraHumes, Joye Wesenberg, PA-C 11/26/17 0543    Molpus, Jonny RuizJohn, MD 11/26/17 [phone]

## 2017-11-26 NOTE — ED Notes (Signed)
Up to the bathroom 

## 2017-11-27 DIAGNOSIS — Z818 Family history of other mental and behavioral disorders: Secondary | ICD-10-CM

## 2017-11-27 DIAGNOSIS — R4587 Impulsiveness: Secondary | ICD-10-CM

## 2017-11-27 DIAGNOSIS — F419 Anxiety disorder, unspecified: Secondary | ICD-10-CM

## 2017-11-27 DIAGNOSIS — Z560 Unemployment, unspecified: Secondary | ICD-10-CM

## 2017-11-27 DIAGNOSIS — Z590 Homelessness: Secondary | ICD-10-CM

## 2017-11-27 DIAGNOSIS — F1310 Sedative, hypnotic or anxiolytic abuse, uncomplicated: Secondary | ICD-10-CM

## 2017-11-27 DIAGNOSIS — F332 Major depressive disorder, recurrent severe without psychotic features: Principal | ICD-10-CM

## 2017-11-27 DIAGNOSIS — F17210 Nicotine dependence, cigarettes, uncomplicated: Secondary | ICD-10-CM

## 2017-11-27 DIAGNOSIS — F10239 Alcohol dependence with withdrawal, unspecified: Secondary | ICD-10-CM

## 2017-11-27 LAB — HEPATITIS PANEL, ACUTE
HEP B C IGM: NEGATIVE
Hep A IgM: NEGATIVE
Hepatitis B Surface Ag: NEGATIVE

## 2017-11-27 MED ORDER — LORAZEPAM 1 MG PO TABS: 1.0000 mg | ORAL_TABLET | Freq: Three times a day (TID) | ORAL | Status: DC

## 2017-11-27 MED ORDER — LORAZEPAM 1 MG PO TABS: 1.0000 mg | ORAL_TABLET | Freq: Every day | ORAL | Status: DC

## 2017-11-27 MED ORDER — ARIPIPRAZOLE 10 MG PO TABS
10.0000 mg | ORAL_TABLET | Freq: Every day | ORAL | Status: DC
  Administered 2017-11-27 – 2017-11-29 (×3): 10 mg via ORAL
  Filled 2017-11-27 (×4): qty 1

## 2017-11-27 MED ORDER — LORAZEPAM 1 MG PO TABS: 1.0000 mg | ORAL_TABLET | Freq: Four times a day (QID) | ORAL | Status: DC | PRN

## 2017-11-27 MED ORDER — OLANZAPINE 10 MG PO TABS
10.0000 mg | ORAL_TABLET | Freq: Every day | ORAL | Status: DC
  Filled 2017-11-27: qty 1

## 2017-11-27 MED ORDER — LORAZEPAM 1 MG PO TABS
1.0000 mg | ORAL_TABLET | Freq: Two times a day (BID) | ORAL | Status: AC
  Administered 2017-11-29 – 2017-11-30 (×2): 1 mg via ORAL
  Filled 2017-11-27 (×2): qty 1

## 2017-11-27 MED ORDER — LORAZEPAM 1 MG PO TABS: 1.0000 mg | ORAL_TABLET | Freq: Two times a day (BID) | ORAL | Status: DC

## 2017-11-27 MED ORDER — LOPERAMIDE HCL 2 MG PO CAPS: 2.0000 mg | ORAL_CAPSULE | ORAL | Status: DC | PRN

## 2017-11-27 MED ORDER — LORAZEPAM 1 MG PO TABS
1.0000 mg | ORAL_TABLET | Freq: Three times a day (TID) | ORAL | Status: AC
  Administered 2017-11-27 – 2017-11-29 (×6): 1 mg via ORAL
  Filled 2017-11-27 (×6): qty 1

## 2017-11-27 MED ORDER — DULOXETINE HCL 30 MG PO CPEP
30.0000 mg | ORAL_CAPSULE | Freq: Every day | ORAL | Status: DC
  Administered 2017-11-27 – 2017-11-29 (×3): 30 mg via ORAL
  Filled 2017-11-27 (×4): qty 1
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
## 2017-11-27 NOTE — [facility] Suicide Risk Assessment (Signed)
[facility] INPATIENT:  Family/Significant Other Suicide Prevention Education  Suicide Prevention Education:  Patient Refusal for Family/Significant Other Suicide Prevention Education: The patient Brandon [name] has refused to provide written consent for family/significant other to be provided Family/Significant Other Suicide Prevention Education during admission and/or prior to discharge.  Physician notified.  SPE completed with patient, as patient refused to consent to family contact. SPI pamphlet provided to pt and pt was encouraged to share information with support network, ask questions, and talk about any concerns relating to SPE. Patient denies access to guns/firearms and verbalized understanding of information provided. Mobile Crisis information also provided to patient.   Maeola SarahJolan E Jameon Deller 11/27/2017, 12:24 PM

## 2017-11-27 NOTE — [facility] Suicide Risk Assessment (Signed)
[facility]   Nursing information obtained from:    Demographic factors:    Current Mental Status:    Loss Factors:    Historical Factors:    Risk Reduction Factors:     Total Time spent with patient: 30 minutes Principal Problem: <principal problem not specified> Diagnosis:   Patient Active Problem List   Diagnosis Date Noted  . MDD (major depressive disorder), recurrent severe, without psychosis (HCC) [F33.2] 11/26/2017  . PTSD (post-traumatic stress disorder) [F43.10] 10/15/2017  . Bipolar I disorder, current or most recent episode depressed, with psychotic features (HCC) [F31.5] 10/14/2017   Subjective Data: Patient is seen and examined.  Patient is a 42 year old male with a known past psychiatric history significant for polysubstance use disorder who [facility] requesting assistance getting clean from drugs.  The patient denied suicidal ideation at the time of his evaluation in the emergency room.  He reported he was using cocaine, alcohol, benzodiazepines.  He stated that he had been staying in [place] at a treatment facility for substance abuse, but then returned to [place] to pick up his car.  That led to a relapse.  He stated that he was using drugs and alcohol over the last 5-6 days.  It was felt necessary to admit him to the hospital for evaluation and stabilization.  He has a reported history of posttraumatic stress disorder as well as major depression.  Continued Clinical Symptoms:  Alcohol Use Disorder Identification Test Final Score (AUDIT): 35 The "Alcohol Use Disorders Identification Test", Guidelines for Use in Primary Care, Second Edition.  World Science writerHealth Organization [facility](WHO). Score between 0-7:  no or low risk or alcohol related problems. Score between 8-15:  moderate risk of alcohol related problems. Score between 16-19:  high risk of alcohol related problems. Score 20 or above:  warrants  further diagnostic evaluation for alcohol dependence and treatment.   CLINICAL FACTORS:   Bipolar Disorder:   Depressive phase Depression:   Anhedonia Comorbid alcohol abuse/dependence Hopelessness Impulsivity Alcohol/Substance Abuse/Dependencies Previous Psychiatric Diagnoses and Treatments   Musculoskeletal: Strength & Muscle Tone: within normal limits Gait & Station: normal Patient leans: N/A  Psychiatric Specialty Exam: Physical Exam  Nursing note and vitals reviewed. Constitutional: He is oriented to person, place, and time. He appears well-developed and well-nourished.  HENT:  Head: Normocephalic and atraumatic.  Respiratory: Effort normal.  Musculoskeletal: Normal range of motion.  Neurological: He is alert and oriented to person, place, and time.    ROS  Blood pressure 98/71, pulse (!) 143, temperature 98 F (36.7 C), temperature source Oral, resp. rate 20, height 6\' 4"  (1.93 m), weight 113.4 kg (250 lb).Body mass index is 30.43 kg/m.  General Appearance: Disheveled  Eye Contact:  Poor  Speech:  Slow  Volume:  Decreased  Mood:  Depressed  Affect:  Congruent  Thought Process:  Coherent  Orientation:  Full (Time, Place, and Person)  Thought Content:  Logical  Suicidal Thoughts:  No  Homicidal Thoughts:  No  Memory:  Immediate;   Fair  Judgement:  Impaired  Insight:  Lacking  Psychomotor Activity:  Restlessness  Concentration:  Concentration: Fair  Recall:  [employer]  Fund of Knowledge:  Fair  Language:  Fair  Akathisia:  No  Handed:  Right  AIMS (if indicated):     Assets:  Desire for Improvement  ADL's:  Intact  Cognition:  WNL  Sleep:  Number of Hours: 6.5      COGNITIVE  FEATURES THAT CONTRIBUTE TO RISK:  None    SUICIDE RISK:   Minimal: No identifiable suicidal ideation.  Patients presenting with no risk factors but with morbid ruminations; may be classified as minimal risk based on the severity of the depressive symptoms  PLAN OF CARE: Patient  is seen and examined.  Patient is a 42 year old male with the above-stated past psychiatric history who seen in follow-up.  He was presenting to "get clean from drugs".  His laboratories in the emergency department were significantly elevated.  Liver ultrasound was essentially negative except for fatty changes.  He will be admitted to the hospital for evaluation and stabilization.  He will be placed in the alcohol withdrawal protocol.  He will be watched for seizures given the benzodiazepines.  He will be integrated into the milieu.  He will be placed back on his medications that he was receiving at discharge.  Apparently the olanzapine was changed to Abilify, and will change that.  He will be instructed to attend groups, to learn coping skills, and a way to be in more control with regard to his addiction.  We will repeat his liver function enzymes tomorrow to make sure that they are going down.  He does complain of some low back pain, and he does have ibuprofen available for him.  Given the elevation of his liver function enzymes I will make sure that he is not on any Tylenol at this time.  I certify that inpatient services furnished can reasonably be expected to improve the patient's condition.   Antonieta Pert, MD 11/27/2017, 12:05 PM

## 2017-11-27 NOTE — [facility] Group Notes (Signed)
Pt was invited but did not attend orientation/goals group. 

## 2017-11-27 NOTE — H&P (Signed)
Psychiatric Admission Assessment Adult  Patient Identification: Brandon [name] MRN:  [id number] Date of Evaluation:  11/27/2017 Chief Complaint:  MDD  cocaine use disorder  alcohol use disorder  Principal Diagnosis: <principal problem not specified> Diagnosis:   Patient Active Problem List   Diagnosis Date Noted  . MDD (major depressive disorder), recurrent severe, without psychosis ([place]) [F33.2] 11/26/2017  . PTSD (post-traumatic stress disorder) [F43.10] 10/15/2017  . Bipolar I disorder, current or most recent episode depressed, with psychotic features ([place]) [F31.5] 10/14/2017   History of Present Illness: Patient is seen and examined.  Patient is a 42 year old male with a reported past psychiatric history significant for posttraumatic stress disorder, bipolar disorder, major depression, alcohol use disorder, benzodiazepine use disorder, cocaine use disorder, methamphetamine use disorder.  He presented to the local emergency room today requesting assistance to get off drugs.  The patient stated that he had been recently hospitalized at the behavioral health Hospital.  He was placed on Cymbalta, Abilify, prazosin, trazodone and BuSpar at that time.  When he left the behavioral health Hospital he went to a residential substance abuse treatment program in [place].  He had been there since discharge.  He stated that he came back to [place] to get his car.  He stated that once he got here he relapsed on drugs, alcohol and benzodiazepines.  He stated he had used methamphetamines in the past but none recently.  He was quite uncomfortable at the time that I evaluated the patient.  He stated he been drinking alcohol "constantly".  He stated that prior to his hospitalization earlier this year he had been at [facility] for substance issues.  He was admitted to the hospital for evaluation and stabilization.  In the emergency room his liver function enzymes were significantly elevated, and they  repeated their labs and had somewhat decreased.  He had an ultrasound that basically just showed fatty infiltration.  We will repeat his liver function enzymes in the a.m. Associated Signs/Symptoms: Depression Symptoms:  depressed mood, anhedonia, insomnia, psychomotor agitation, fatigue, feelings of worthlessness/guilt, difficulty concentrating, hopelessness, anxiety, loss of energy/fatigue, disturbed sleep, (Hypo) Manic Symptoms:  Impulsivity, Anxiety Symptoms:  Excessive Worry, Psychotic Symptoms:  None PTSD Symptoms: Had a traumatic exposure:  In the past Total Time spent with patient: 45 minutes  Past Psychiatric History: Patient has been admitted to the psychiatric hospital on several occasions.  The patient stated he had been in detox at least 7-10 times, he had been in substance abuse rehabilitation on at least 7-10 times.  He is currently in a rehabilitation facility in [place].  Is the patient at risk to self? No.  Has the patient been a risk to self in the past 6 months? Yes.    Has the patient been a risk to self within the distant past? Yes.    Is the patient a risk to others? No.  Has the patient been a risk to others in the past 6 months? No.  Has the patient been a risk to others within the distant past? No.   Prior Inpatient Therapy:   Prior Outpatient Therapy:    Alcohol Screening: 1. How often do you have a drink containing alcohol?: 4 or more times a week 2. How many drinks containing alcohol do you have on a typical day when you are drinking?: 10 or more 3. How often do you have six or more drinks on one occasion?: Daily or almost daily AUDIT-C Score: 12 4. How often during the last year  have you found that you were not able to stop drinking once you had started?: Daily or almost daily 5. How often during the last year have you failed to do what was normally expected from you becasue of drinking?: Daily or almost daily 6. How often during the last year  have you needed a first drink in the morning to get yourself going after a heavy drinking session?: Daily or almost daily 7. How often during the last year have you had a feeling of guilt of remorse after drinking?: Daily or almost daily 8. How often during the last year have you been unable to remember what happened the night before because you had been drinking?: Weekly 9. Have you or someone else been injured as a result of your drinking?: No 10. Has a relative or friend or a doctor or another health worker been concerned about your drinking or suggested you cut down?: Yes, during the last year Alcohol Use Disorder Identification Test Final Score (AUDIT): 35 Intervention/Follow-up: Alcohol Education Substance Abuse History in the last 12 months:  Yes.   Consequences of Substance Abuse: Basically unemployment, homelessness, distance from friends and family. Previous Psychotropic Medications: Yes  Psychological Evaluations: Yes  Past Medical History:  Past Medical History:  Diagnosis Date  . Anxiety   . Depression   . Hyperlipidemia   . Substance abuse ([place])   . Thyroid disease    History reviewed. No pertinent surgical history. Family History:  Family History  Problem Relation Age of Onset  . Diabetes Father    Family Psychiatric  History: Patient reported depression and anxiety in his father and mother. Tobacco Screening: Have you used any form of tobacco in the last 30 days? (Cigarettes, Smokeless Tobacco, Cigars, and/or Pipes): Yes Tobacco use, Select all that apply: 5 or more cigarettes per day Are you interested in Tobacco Cessation Medications?: Yes, will notify MD for an order Counseled patient on smoking cessation including recognizing danger situations, developing coping skills and basic information about quitting provided: Refused/Declined practical counseling Social History:  Social History   Substance and Sexual Activity  Alcohol Use Yes     Social History    Substance and Sexual Activity  Drug Use Yes  . Types: "Crack" cocaine    Additional Social History:                           Allergies:  No Known Allergies Lab Results:  Results for orders placed or performed during the hospital encounter of 11/25/17 (from the past 48 hour(s))  Comprehensive metabolic panel     Status: Abnormal   Collection Time: 11/25/17 10:48 PM  Result Value Ref Range   Sodium 139 135 - 145 mmol/L   Potassium 3.9 3.5 - 5.1 mmol/L   Chloride 103 101 - 111 mmol/L   CO2 23 22 - 32 mmol/L   Glucose, Bld 122 (H) 65 - 99 mg/dL   BUN 24 (H) 6 - 20 mg/dL   Creatinine, Ser 1.38 (H) 0.61 - 1.24 mg/dL   Calcium 9.8 8.9 - 10.3 mg/dL   Total Protein 8.2 (H) 6.5 - 8.1 g/dL   Albumin 4.8 3.5 - 5.0 g/dL   AST 789 (H) 15 - 41 U/L   ALT 188 (H) 17 - 63 U/L   Alkaline Phosphatase 88 38 - 126 U/L   Total Bilirubin 1.9 (H) 0.3 - 1.2 mg/dL   GFR calc non Af Amer >60 >60 mL/min  GFR calc Af Amer >60 >60 mL/min    Comment: (NOTE) The eGFR has been calculated using the CKD EPI equation. This calculation has not been validated in all clinical situations. eGFR's persistently <60 mL/min signify possible Chronic Kidney Disease.    Anion gap 13 5 - 15    Comment: Performed at [facility], [place] [address] [place], [place] [address]  Ethanol     Status: None   Collection Time: 11/25/17 10:48 PM  Result Value Ref Range   Alcohol, Ethyl (B) <10 <10 mg/dL    Comment:        LOWEST DETECTABLE LIMIT FOR SERUM ALCOHOL IS 10 mg/dL FOR MEDICAL PURPOSES ONLY Performed at [place] [address], [place], [place] [address]   cbc     Status: Abnormal   Collection Time: 11/25/17 10:48 PM  Result Value Ref Range   WBC 13.9 (H) 4.0 - 10.5 K/uL   RBC 5.53 4.22 - 5.81 MIL/uL   Hemoglobin 16.9 13.0 - 17.0 g/dL   HCT 48.4 39.0 - 52.0 %   MCV 87.5 78.0 - 100.0 fL   MCH 30.6 26.0 - 34.0 pg   MCHC 34.9 30.0 - 36.0 g/dL   RDW 14.3  11.5 - 15.5 %   Platelets 479 (H) 150 - 400 K/uL    Comment: Performed at [facility], [place] [address] [place], [place] [address]  Salicylate level     Status: None   Collection Time: 11/25/17 10:48 PM  Result Value Ref Range   Salicylate Lvl <2.2 2.8 - 30.0 mg/dL    Comment: Performed at [facility], [place] [address] [place], [place] [address]  Acetaminophen level     Status: Abnormal   Collection Time: 11/25/17 10:48 PM  Result Value Ref Range   Acetaminophen (Tylenol), Serum <10 (L) 10 - 30 ug/mL    Comment:        THERAPEUTIC CONCENTRATIONS VARY SIGNIFICANTLY. A RANGE OF 10-30 ug/mL MAY BE AN EFFECTIVE CONCENTRATION FOR MANY PATIENTS. HOWEVER, SOME ARE BEST TREATED AT CONCENTRATIONS OUTSIDE THIS RANGE. ACETAMINOPHEN CONCENTRATIONS >150 ug/mL AT 4 HOURS AFTER INGESTION AND >50 ug/mL AT 12 HOURS AFTER INGESTION ARE OFTEN ASSOCIATED WITH TOXIC REACTIONS. Performed at [facility], [place] [address], [place], [place] [address]   Hepatitis panel, acute     Status: None   Collection Time: 11/26/17 12:34 AM  Result Value Ref Range   Hepatitis B Surface Ag Negative Negative   HCV Ab <0.1 0.0 - 0.9 s/co ratio    Comment: (NOTE)                                  Negative:     < 0.8                             Indeterminate: 0.8 - 0.9                                  Positive:     > 0.9 The CDC recommends that a positive HCV antibody result be followed up with a HCV Nucleic Acid Amplification test (417408). Performed At: [facility] [place], [place] [id number] Rush Farmer MD JS:[id number]    Hep A IgM Negative Negative  Hep B C IgM Negative Negative    Comment: Performed at [facility], [place] [address], [place], [place] [address]  Rapid urine drug screen (hospital performed)     Status: Abnormal   Collection Time: 11/26/17  9:19 AM  Result Value Ref Range   Opiates NONE  DETECTED NONE DETECTED   Cocaine POSITIVE (A) NONE DETECTED   Benzodiazepines POSITIVE (A) NONE DETECTED   Amphetamines NONE DETECTED NONE DETECTED   Tetrahydrocannabinol NONE DETECTED NONE DETECTED   Barbiturates NONE DETECTED NONE DETECTED    Comment: (NOTE) DRUG SCREEN FOR MEDICAL PURPOSES ONLY.  IF CONFIRMATION IS NEEDED FOR ANY PURPOSE, NOTIFY LAB WITHIN 5 DAYS. LOWEST DETECTABLE LIMITS FOR URINE DRUG SCREEN Drug Class                     Cutoff (ng/mL) Amphetamine and metabolites    1000 Barbiturate and metabolites    200 Benzodiazepine                 283 Tricyclics and metabolites     300 Opiates and metabolites        300 Cocaine and metabolites        300 THC                            50 Performed at [facility], [place] [address], [place], [place] [address]   Urinalysis, Routine w reflex microscopic     Status: Abnormal   Collection Time: 11/26/17  9:19 AM  Result Value Ref Range   Color, Urine AMBER (A) YELLOW    Comment: BIOCHEMICALS MAY BE AFFECTED BY COLOR   APPearance HAZY (A) CLEAR   Specific Gravity, Urine 1.032 (H) 1.005 - 1.030   pH 5.0 5.0 - 8.0   Glucose, UA NEGATIVE NEGATIVE mg/dL   Hgb urine dipstick MODERATE (A) NEGATIVE   Bilirubin Urine NEGATIVE NEGATIVE   Ketones, ur 5 (A) NEGATIVE mg/dL   Protein, ur 100 (A) NEGATIVE mg/dL   Nitrite NEGATIVE NEGATIVE   Leukocytes, UA NEGATIVE NEGATIVE   RBC / HPF 0-5 0 - 5 RBC/hpf   WBC, UA 0-5 0 - 5 WBC/hpf   Bacteria, UA FEW (A) NONE SEEN   Squamous Epithelial / LPF 0-5 (A) NONE SEEN   Mucus PRESENT    Hyaline Casts, UA PRESENT     Comment: Performed at [facility], [place] [address], [place], [place] [address]  Comprehensive metabolic panel     Status: Abnormal   Collection Time: 11/26/17  9:55 AM  Result Value Ref Range   Sodium 137 135 - 145 mmol/L   Potassium 3.9 3.5 - 5.1 mmol/L   Chloride 101 101 - 111 mmol/L   CO2 26 22 - 32 mmol/L   Glucose, Bld 106 (H) 65 -  99 mg/dL   BUN 33 (H) 6 - 20 mg/dL   Creatinine, Ser 1.65 (H) 0.61 - 1.24 mg/dL   Calcium 9.1 8.9 - 10.3 mg/dL   Total Protein 6.9 6.5 - 8.1 g/dL   Albumin 4.1 3.5 - 5.0 g/dL   AST 614 (H) 15 - 41 U/L   ALT 160 (H) 17 - 63 U/L   Alkaline Phosphatase 77 38 - 126 U/L   Total Bilirubin 1.4 (H) 0.3 - 1.2 mg/dL   GFR calc non Af Amer 51 (L) >60 mL/min   GFR calc Af Amer 59 (L) >60 mL/min  Comment: (NOTE) The eGFR has been calculated using the CKD EPI equation. This calculation has not been validated in all clinical situations. eGFR's persistently <60 mL/min signify possible Chronic Kidney Disease.    Anion gap 10 5 - 15    Comment: Performed at [facility], [place] [address] [place], [place] [address]  Protime-INR     Status: None   Collection Time: 11/26/17  9:55 AM  Result Value Ref Range   Prothrombin Time 13.1 11.4 - 15.2 seconds   INR 1.00     Comment: Performed at [facility], [place] [address], [place], [place] [address]  Lipase, blood     Status: None   Collection Time: 11/26/17  9:55 AM  Result Value Ref Range   Lipase 27 11 - 51 U/L    Comment: Performed at [facility], [place] [address], [place], [place] [address]    Blood Alcohol level:  Lab Results  Component Value Date   ETH <10 11/25/2017   ETH <10 88/50/2774    Metabolic Disorder Labs:  No results found for: HGBA1C, MPG No results found for: PROLACTIN No results found for: CHOL, TRIG, HDL, CHOLHDL, VLDL, LDLCALC  Current Medications: Current Facility-Administered Medications  Medication Dose Route Frequency Provider Last Rate Last Dose  . alum & mag hydroxide-simeth (MAALOX/MYLANTA) 200-200-20 MG/5ML suspension 30 mL  30 mL Oral Q4H PRN Ethelene Hal, NP      . ARIPiprazole (ABILIFY) tablet 10 mg  10 mg Oral QHS Sharma Covert, MD      . haloperidol (HALDOL) tablet 5 mg  5 mg Oral Q6H PRN Ethelene Hal, NP   5 mg at 11/27/17 1037    And  . benztropine (COGENTIN) tablet 1 mg  1 mg Oral Q6H PRN Ethelene Hal, NP   1 mg at 11/26/17 2227  . busPIRone (BUSPAR) tablet 10 mg  10 mg Oral BID Ethelene Hal, NP   10 mg at 11/27/17 1036  . DULoxetine (CYMBALTA) DR capsule 30 mg  30 mg Oral Daily Sharma Covert, MD      . famotidine (PEPCID) tablet 20 mg  20 mg Oral BID Ethelene Hal, NP   20 mg at 11/27/17 1037  . gi cocktail (Maalox,Lidocaine,Donnatal)  30 mL Oral TID PRN Ethelene Hal, NP      . hydrOXYzine (ATARAX/VISTARIL) tablet 25 mg  25 mg Oral TID PRN Ethelene Hal, NP   25 mg at 11/26/17 2227  . ibuprofen (ADVIL,MOTRIN) tablet 800 mg  800 mg Oral Q8H PRN Ethelene Hal, NP   800 mg at 11/27/17 1037  . levothyroxine (SYNTHROID, LEVOTHROID) tablet 88 mcg  88 mcg Oral QAC breakfast Ethelene Hal, NP   88 mcg at 11/27/17 1038  . lidocaine (LIDODERM) 5 % 1 patch  1 patch Transdermal QHS Ethelene Hal, NP   1 patch at 11/26/17 2227  . LORazepam (ATIVAN) injection 0-4 mg  0-4 mg Intravenous Q6H Ethelene Hal, NP       Or  . LORazepam (ATIVAN) tablet 0-4 mg  0-4 mg Oral Q6H Ethelene Hal, NP   2 mg at 11/27/17 1036  . [START ON 11/28/2017] LORazepam (ATIVAN) injection 0-4 mg  0-4 mg Intravenous Q12H Ethelene Hal, NP       Or  . Derrill Memo ON 11/28/2017] LORazepam (ATIVAN) tablet 0-4 mg  0-4 mg Oral Q12H Ethelene Hal, NP      .  magnesium hydroxide (MILK OF MAGNESIA) suspension 30 mL  30 mL Oral Daily PRN Ethelene Hal, NP      . methocarbamol (ROBAXIN) tablet 500 mg  500 mg Oral Q8H PRN Ethelene Hal, NP   500 mg at 11/27/17 1036  . nicotine (NICODERM CQ - dosed in mg/24 hours) patch 21 mg  21 mg Transdermal Daily Ethelene Hal, NP   21 mg at 11/27/17 1038  . ondansetron (ZOFRAN) tablet 4 mg  4 mg Oral Q8H PRN Ethelene Hal, NP      . prazosin (MINIPRESS) capsule 3 mg  3 mg Oral QHS Ethelene Hal, NP       . thiamine (VITAMIN B-1) tablet 100 mg  100 mg Oral Daily Ethelene Hal, NP   100 mg at 11/27/17 1037   Or  . thiamine (B-1) injection 100 mg  100 mg Intravenous Daily Ethelene Hal, NP      . traZODone (DESYREL) tablet 300 mg  300 mg Oral QHS Ethelene Hal, NP   300 mg at 11/26/17 2226   PTA Medications: Medications Prior to Admission  Medication Sig Dispense Refill Last Dose  . ARIPiprazole (ABILIFY) 5 MG tablet Take 5 mg by mouth daily.   Past Week at Unknown time  . busPIRone (BUSPAR) 10 MG tablet Take 10 mg by mouth 2 (two) times daily.   Past Week at Unknown time  . DULoxetine (CYMBALTA) 30 MG capsule Take 1 capsule (30 mg total) by mouth daily. For depression 30 capsule 0 Past Week at Unknown time  . hydrOXYzine (ATARAX/VISTARIL) 50 MG tablet Take 1 tablet (50 mg total) by mouth every 6 (six) hours as needed for anxiety. (Patient not taking: Reported on 11/25/2017) 60 tablet 0 Not Taking at Unknown time  . levothyroxine (SYNTHROID, LEVOTHROID) 88 MCG tablet Take 1 tablet (88 mcg total) by mouth daily before breakfast. For thyroid hormone replace 30 tablet 0 Past Week at Unknown time  . nicotine (NICODERM CQ - DOSED IN MG/24 HOURS) 21 mg/24hr patch Place 1 patch (21 mg total) onto the skin daily. (May buy from over the counter): For smoking cessation (Patient not taking: Reported on 11/25/2017) 1 patch 0 Not Taking at Unknown time  . OLANZapine (ZYPREXA) 10 MG tablet Take 1 tablet (10 mg total) by mouth at bedtime. For mood control (Patient not taking: Reported on 11/25/2017) 30 tablet 0 Not Taking at Unknown time  . omeprazole (PRILOSEC) 20 MG capsule Take 1 capsule (20 mg total) by mouth daily. For acid reflux (Patient taking differently: Take 40 mg by mouth daily. For acid reflux) 15 capsule 0 Past Week at Unknown time  . ondansetron (ZOFRAN) 4 MG tablet Take 1 tablet (4 mg total) by mouth every 6 (six) hours. For nausea (Patient not taking: Reported on 11/25/2017) 1  tablet 0 Not Taking at Unknown time  . prazosin (MINIPRESS) 1 MG capsule Take 3 capsules (3 mg total) by mouth at bedtime. For nightmares (Patient taking differently: Take 5 mg by mouth at bedtime. For nightmares) 90 capsule 0 Past Week at Unknown time  . traZODone (DESYREL) 300 MG tablet Take 1 tablet (300 mg total) by mouth at bedtime. For sleep 30 tablet 0 Past Week at Unknown time    Musculoskeletal: Strength & Muscle Tone: within normal limits Gait & Station: unsteady Patient leans: N/A  Psychiatric Specialty Exam: Physical Exam  Nursing note and vitals reviewed. Constitutional: He is oriented to person, place, and time. He  appears well-developed and well-nourished.  HENT:  Head: Normocephalic and atraumatic.  Respiratory: Effort normal.  Neurological: He is alert and oriented to person, place, and time.    ROS  Blood pressure 98/71, pulse (!) 143, temperature 98 F (36.7 C), temperature source Oral, resp. rate 20, height _0  (1.93 m), weight 113.4 kg (250 lb).Body mass index is 30.43 kg/m.  General Appearance: Disheveled  Eye Contact:  Poor  Speech:  Slow  Volume:  Decreased  Mood:  Dysphoric  Affect:  Congruent  Thought Process:  Coherent  Orientation:  Full (Time, Place, and Person)  Thought Content:  Logical  Suicidal Thoughts:  No  Homicidal Thoughts:  No  Memory:  Immediate;   Fair  Judgement:  Impaired  Insight:  Lacking  Psychomotor Activity:  Restlessness  Concentration:  Concentration: Fair  Recall:  [employer] of Knowledge:  Fair  Language:  Fair  Akathisia:  No  Handed:  Right  AIMS (if indicated):     Assets:  Desire for Improvement  ADL's:  Intact  Cognition:  WNL  Sleep:  Number of Hours: 6.5    Treatment Plan Summary: Daily contact with patient to assess and evaluate symptoms and progress in treatment, Medication management and Plan Patient is seen and examined.  Patient is a 42 year old male with the above-stated past medical and psychiatric  history who presented to a local emergency room for evaluation for possible admission due to substance dependence.  His liver enzymes were significantly elevated in the emergency room.  He appeared to be having withdrawal symptoms.  He was admitted to the hospital for evaluation and stabilization.  He will be placed in the alcohol withdrawal protocol.  He will be watched for withdrawal from benzodiazepines.  His liver function enzymes will be monitored given the elevation.  Tylenol was originally ordered, this will be stopped because of the increase in liver function enzymes.  He has ibuprofen available for him.  He will be placed back on his Cymbalta, Abilify, BuSpar, prazosin and trazodone.  He will be integrated into the milieu.  He will attend groups and attempt to develop coping skills and to cope better with his addiction.  He also be seen individually by social work.  Hopefully we will be able to return him to the previous rehabilitation facility once he is detoxed.  Observation Level/Precautions:  Detox 15 minute checks  Laboratory:  Chemistry Profile  Psychotherapy:    Medications:    Consultations:    Discharge Concerns:    Estimated LOS:  Other:     Physician Treatment Plan for Primary Diagnosis: <principal problem not specified> Long Term Goal(s): Improvement in symptoms so as ready for discharge  Short Term Goals: Ability to identify changes in lifestyle to reduce recurrence of condition will improve, Ability to verbalize feelings will improve, Ability to disclose and discuss suicidal ideas, Ability to demonstrate self-control will improve, Ability to identify and develop effective coping behaviors will improve, Ability to maintain clinical measurements within normal limits will improve, Compliance with prescribed medications will improve and Ability to identify triggers associated with substance abuse/mental health issues will improve  Physician Treatment Plan for Secondary Diagnosis:  Active Problems:   MDD (major depressive disorder), recurrent severe, without psychosis ([place])  Long Term Goal(s): Improvement in symptoms so as ready for discharge  Short Term Goals: Ability to identify changes in lifestyle to reduce recurrence of condition will improve, Ability to verbalize feelings will improve, Ability to disclose and discuss suicidal ideas,  Ability to demonstrate self-control will improve, Ability to identify and develop effective coping behaviors will improve, Ability to maintain clinical measurements within normal limits will improve, Compliance with prescribed medications will improve and Ability to identify triggers associated with substance abuse/mental health issues will improve  I certify that inpatient services furnished can reasonably be expected to improve the patient's condition.    Sharma Covert, MD 4/11/201912:11 PM

## 2017-11-27 NOTE — [facility] Group Notes (Signed)
LCSW Group Therapy Note 11/27/2017 2:48 PM  Type of Therapy and Topic: Group Therapy: Avoiding Self-Sabotaging and Enabling Behaviors  Participation Level: Did Not Attend  Description of Group:  In this group, patients will learn how to identify obstacles, self-sabotaging and enabling behaviors, as well as: what are they, why do we do them and what needs these behaviors meet. Discuss unhealthy relationships and how to have positive healthy boundaries with those that sabotage and enable. Explore aspects of self-sabotage and enabling in yourself and how to limit these self-destructive behaviors in everyday life.  Therapeutic Goals: 1. Patient will identify one obstacle that relates to self-sabotage and enabling behaviors 2. Patient will identify one personal self-sabotaging or enabling behavior they did prior to admission 3. Patient will state a plan to change the above identified behavior 4. Patient will demonstrate ability to communicate their needs through discussion and/or role play.   Summary of Patient Progress:  Invited, chose not to attend.    Therapeutic Modalities:  Cognitive Behavioral Therapy Person-Centered Therapy Motivational Interviewing   Baldo DaubJolan Alaena Strader LCSWA Clinical Social Worker

## 2017-11-27 NOTE — Progress Notes (Signed)
Dar Note: Patient presents with irritable affect and mood.  Reports withdrawal symptoms of cravings, chilling, anxiety, irritability, agitation and restlessness.  Denies suicidal thoughts and contracts for safety.  Medications given as prescribed and prn requested and given.  Spent majority of this shift in his room sleeping.  Support and encouragement offered as needed.  Routine safety checks maintained every 15 minutes.  Patient on Ativan protocol for withdrawal.  Patient is safe on the unit.  Unable to participate in group due to symptoms.

## 2017-11-27 NOTE — Progress Notes (Signed)
Patient ID: Brandon [name], male   DOB: Sep 09, 1975, 42 y.o.   MRN: [id number]  Pt currently presents with an anxious affect and behavior. Pt guarded, interacts minimally with staff and peers. Needy with Clinical research associatewriter during assessment and medication pass. Interaction superficial. Reports ongoing back pain 7.5 out of 10. Pt reports good sleep with current medication regimen.   Pt provided with medications per providers orders. Pt's labs and vitals were monitored throughout the night. Pt given a 1:1 about emotional and mental status. Pt supported and encouraged to express concerns and questions. Pt educated on medications.  Pt's safety ensured with 15 minute and environmental checks. Pt currently denies SI/HI and A/V hallucinations. Pt verbally agrees to seek staff if SI/HI or A/VH occurs and to consult with staff before acting on any harmful thoughts. Will continue POC.

## 2017-11-28 LAB — HEPATIC FUNCTION PANEL
ALT: 155 U/L — ABNORMAL HIGH (ref 17–63)
AST: 451 U/L — ABNORMAL HIGH (ref 15–41)
Albumin: 3.4 g/dL — ABNORMAL LOW (ref 3.5–5.0)
Alkaline Phosphatase: 69 U/L (ref 38–126)
Bilirubin, Direct: 0.1 mg/dL (ref 0.1–0.5)
Indirect Bilirubin: 0.3 mg/dL (ref 0.3–0.9)
Total Bilirubin: 0.4 mg/dL (ref 0.3–1.2)
Total Protein: 6.1 g/dL — ABNORMAL LOW (ref 6.5–8.1)

## 2017-11-28 MED ORDER — PANTOPRAZOLE SODIUM 40 MG PO TBEC
40.0000 mg | DELAYED_RELEASE_TABLET | Freq: Every day | ORAL | Status: DC
  Administered 2017-11-28 – 2017-11-30 (×3): 40 mg via ORAL
  Filled 2017-11-28 (×5): qty 1

## 2017-11-28 NOTE — Progress Notes (Signed)
  DATA ACTION RESPONSE  Objective- Pt. is visible in the dayroom, seen eating a snack.Presents with an anxious affect and mood. Pt states he hopes to be d/c soon to home. Pt c/o of withdrawal s/s, back pain, and anxiety this evening.   Subjective- Denies having any SI/HI/AVH/Pain at this time. Is cooperative and remains safe on the unit.  1:1 interaction in private to establish rapport. Encouragement, education, & support given from staff.  PRN vistaril and robaxin requested and will re-eval accordingly.   Safety maintained with Q 15 checks. Continue with POC.

## 2017-11-28 NOTE — [facility] Group Notes (Signed)
LCSW Group Therapy Note 11/28/2017 3:11 PM  Type of Therapy and Topic: Group Therapy: Feelings around Relapse and Recovery  Participation Level: Did Not Attend   Description of Group:  Patients in this group will discuss emotions they experience before and after a relapse. They will process how experiencing these feelings, or avoidance of experiencing them, relates to having a relapse. Facilitator will guide patients to explore emotions they have related to recovery. Patients will be encouraged to process which emotions are more powerful. They will be guided to discuss the emotional reaction significant others in their lives may have to their relapse or recovery. Patients will be assisted in exploring ways to respond to the emotions of others without this contributing to a relapse.  Therapeutic Goals: 1. Patient will identify two or more emotions that lead to a relapse for them 2. Patient will identify two emotions that result when they relapse 3. Patient will identify two emotions related to recovery 4. Patient will demonstrate ability to communicate their needs through discussion and/or role plays  Summary of Patient Progress:  Invited, chose not to attend.    Therapeutic Modalities:  Cognitive Behavioral Therapy Solution-Focused Therapy Assertiveness Training Relapse Prevention Therapy   Alcario DroughtJolan Syncere Kaminski LCSWA Clinical Social Worker

## 2017-11-28 NOTE — Plan of Care (Signed)
Nurse discussed depression, anxiety, coping skills with patient.  

## 2017-11-28 NOTE — Progress Notes (Signed)
Pt attend wrap up group. His day was a 5. His goal was to get his medication regular. Pt said he need to sleep and rest.

## 2017-11-28 NOTE — Tx Team (Signed)
Interdisciplinary Treatment and Diagnostic Plan Update  11/28/2017 Time of Session: 10:20am Brandon [name] MRN: [id number]  Principal Diagnosis: <principal problem not specified>  Secondary Diagnoses: Active Problems:   MDD (major depressive disorder), recurrent severe, without psychosis ([place])   Alcohol dependence with withdrawal with complication ([place])   Current Medications:  Current Facility-Administered Medications  Medication Dose Route Frequency Provider Last Rate Last Dose  . alum & mag hydroxide-simeth (MAALOX/MYLANTA) 200-200-20 MG/5ML suspension 30 mL  30 mL Oral Q4H PRN Ethelene Hal, NP   30 mL at 11/28/17 0814  . ARIPiprazole (ABILIFY) tablet 10 mg  10 mg Oral QHS Sharma Covert, MD   10 mg at 11/27/17 2115  . haloperidol (HALDOL) tablet 5 mg  5 mg Oral Q6H PRN Ethelene Hal, NP   5 mg at 11/28/17 4098   And  . benztropine (COGENTIN) tablet 1 mg  1 mg Oral Q6H PRN Ethelene Hal, NP   1 mg at 11/28/17 1191  . busPIRone (BUSPAR) tablet 10 mg  10 mg Oral BID Ethelene Hal, NP   10 mg at 11/28/17 0803  . DULoxetine (CYMBALTA) DR capsule 30 mg  30 mg Oral Daily Sharma Covert, MD   30 mg at 11/28/17 0803  . gi cocktail (Maalox,Lidocaine,Donnatal)  30 mL Oral TID PRN Ethelene Hal, NP      . hydrOXYzine (ATARAX/VISTARIL) tablet 25 mg  25 mg Oral TID PRN Ethelene Hal, NP   25 mg at 11/26/17 2227  . ibuprofen (ADVIL,MOTRIN) tablet 800 mg  800 mg Oral Q8H PRN Ethelene Hal, NP   800 mg at 11/28/17 0819  . levothyroxine (SYNTHROID, LEVOTHROID) tablet 88 mcg  88 mcg Oral QAC breakfast Ethelene Hal, NP   88 mcg at 11/28/17 0631  . lidocaine (LIDODERM) 5 % 1 patch  1 patch Transdermal QHS Ethelene Hal, NP   1 patch at 11/27/17 2115  . loperamide (IMODIUM) capsule 2-4 mg  2-4 mg Oral PRN Lindon Romp A, NP      . LORazepam (ATIVAN) tablet 1 mg  1 mg Oral Q6H PRN Rozetta Nunnery, NP      . Derrill Memo ON  12/01/2017] LORazepam (ATIVAN) tablet 1 mg  1 mg Oral Daily Rozetta Nunnery, NP       Followed by  . LORazepam (ATIVAN) tablet 1 mg  1 mg Oral TID Lindon Romp A, NP   1 mg at 11/28/17 0807   Followed by  . [START ON 11/29/2017] LORazepam (ATIVAN) tablet 1 mg  1 mg Oral BID Rozetta Nunnery, NP       Followed by  . [START ON 12/02/2017] LORazepam (ATIVAN) tablet 1 mg  1 mg Oral Daily Lindon Romp A, NP      . magnesium hydroxide (MILK OF MAGNESIA) suspension 30 mL  30 mL Oral Daily PRN Ethelene Hal, NP      . methocarbamol (ROBAXIN) tablet 500 mg  500 mg Oral Q8H PRN Ethelene Hal, NP   500 mg at 11/28/17 0223  . nicotine (NICODERM CQ - dosed in mg/24 hours) patch 21 mg  21 mg Transdermal Daily Ethelene Hal, NP   21 mg at 11/28/17 0805  . ondansetron (ZOFRAN) tablet 4 mg  4 mg Oral Q8H PRN Ethelene Hal, NP      . pantoprazole (PROTONIX) EC tablet 40 mg  40 mg Oral Daily Sharma Covert, MD   40 mg at 11/28/17 1112  .  prazosin (MINIPRESS) capsule 3 mg  3 mg Oral QHS Ethelene Hal, NP   3 mg at 11/27/17 2115  . thiamine (VITAMIN B-1) tablet 100 mg  100 mg Oral Daily Ethelene Hal, NP   100 mg at 11/28/17 4825   Or  . thiamine (B-1) injection 100 mg  100 mg Intravenous Daily Ethelene Hal, NP      . traZODone (DESYREL) tablet 300 mg  300 mg Oral QHS Ethelene Hal, NP   300 mg at 11/27/17 2115   PTA Medications: Medications Prior to Admission  Medication Sig Dispense Refill Last Dose  . ARIPiprazole (ABILIFY) 5 MG tablet Take 5 mg by mouth daily.   Past Week at Unknown time  . busPIRone (BUSPAR) 10 MG tablet Take 10 mg by mouth 2 (two) times daily.   Past Week at Unknown time  . DULoxetine (CYMBALTA) 30 MG capsule Take 1 capsule (30 mg total) by mouth daily. For depression 30 capsule 0 Past Week at Unknown time  . hydrOXYzine (ATARAX/VISTARIL) 50 MG tablet Take 1 tablet (50 mg total) by mouth every 6 (six) hours as needed for  anxiety. (Patient not taking: Reported on 11/25/2017) 60 tablet 0 Not Taking at Unknown time  . levothyroxine (SYNTHROID, LEVOTHROID) 88 MCG tablet Take 1 tablet (88 mcg total) by mouth daily before breakfast. For thyroid hormone replace 30 tablet 0 Past Week at Unknown time  . nicotine (NICODERM CQ - DOSED IN MG/24 HOURS) 21 mg/24hr patch Place 1 patch (21 mg total) onto the skin daily. (May buy from over the counter): For smoking cessation (Patient not taking: Reported on 11/25/2017) 1 patch 0 Not Taking at Unknown time  . OLANZapine (ZYPREXA) 10 MG tablet Take 1 tablet (10 mg total) by mouth at bedtime. For mood control (Patient not taking: Reported on 11/25/2017) 30 tablet 0 Not Taking at Unknown time  . omeprazole (PRILOSEC) 20 MG capsule Take 1 capsule (20 mg total) by mouth daily. For acid reflux (Patient taking differently: Take 40 mg by mouth daily. For acid reflux) 15 capsule 0 Past Week at Unknown time  . ondansetron (ZOFRAN) 4 MG tablet Take 1 tablet (4 mg total) by mouth every 6 (six) hours. For nausea (Patient not taking: Reported on 11/25/2017) 1 tablet 0 Not Taking at Unknown time  . prazosin (MINIPRESS) 1 MG capsule Take 3 capsules (3 mg total) by mouth at bedtime. For nightmares (Patient taking differently: Take 5 mg by mouth at bedtime. For nightmares) 90 capsule 0 Past Week at Unknown time  . traZODone (DESYREL) 300 MG tablet Take 1 tablet (300 mg total) by mouth at bedtime. For sleep 30 tablet 0 Past Week at Unknown time    Patient Stressors: Medication change or noncompliance Substance abuse  Patient Strengths: Ability for insight Average or above average intelligence Capable of independent living General fund of knowledge Motivation for treatment/growth  Treatment Modalities: Medication Management, Group therapy, Case management,  1 to 1 session with clinician, Psychoeducation, Recreational therapy.   Physician Treatment Plan for Primary Diagnosis: <principal problem not  specified> Long Term Goal(s): Improvement in symptoms so as ready for discharge Improvement in symptoms so as ready for discharge   Short Term Goals: Ability to identify changes in lifestyle to reduce recurrence of condition will improve Ability to verbalize feelings will improve Ability to disclose and discuss suicidal ideas Ability to demonstrate self-control will improve Ability to identify and develop effective coping behaviors will improve Ability to maintain clinical measurements  within normal limits will improve Compliance with prescribed medications will improve Ability to identify triggers associated with substance abuse/mental health issues will improve Ability to identify changes in lifestyle to reduce recurrence of condition will improve Ability to verbalize feelings will improve Ability to disclose and discuss suicidal ideas Ability to demonstrate self-control will improve Ability to identify and develop effective coping behaviors will improve Ability to maintain clinical measurements within normal limits will improve Compliance with prescribed medications will improve Ability to identify triggers associated with substance abuse/mental health issues will improve  Medication Management: Evaluate patient's response, side effects, and tolerance of medication regimen.  Therapeutic Interventions: 1 to 1 sessions, Unit Group sessions and Medication administration.  Evaluation of Outcomes: Not Met  Physician Treatment Plan for Secondary Diagnosis: Active Problems:   MDD (major depressive disorder), recurrent severe, without psychosis ([place])   Alcohol dependence with withdrawal with complication ([place])  Long Term Goal(s): Improvement in symptoms so as ready for discharge Improvement in symptoms so as ready for discharge   Short Term Goals: Ability to identify changes in lifestyle to reduce recurrence of condition will improve Ability to verbalize feelings will improve Ability  to disclose and discuss suicidal ideas Ability to demonstrate self-control will improve Ability to identify and develop effective coping behaviors will improve Ability to maintain clinical measurements within normal limits will improve Compliance with prescribed medications will improve Ability to identify triggers associated with substance abuse/mental health issues will improve Ability to identify changes in lifestyle to reduce recurrence of condition will improve Ability to verbalize feelings will improve Ability to disclose and discuss suicidal ideas Ability to demonstrate self-control will improve Ability to identify and develop effective coping behaviors will improve Ability to maintain clinical measurements within normal limits will improve Compliance with prescribed medications will improve Ability to identify triggers associated with substance abuse/mental health issues will improve     Medication Management: Evaluate patient's response, side effects, and tolerance of medication regimen.  Therapeutic Interventions: 1 to 1 sessions, Unit Group sessions and Medication administration.  Evaluation of Outcomes: Not Met   RN Treatment Plan for Primary Diagnosis: <principal problem not specified> Long Term Goal(s): Knowledge of disease and therapeutic regimen to maintain health will improve  Short Term Goals: Ability to remain free from injury will improve, Ability to verbalize frustration and anger appropriately will improve, Ability to demonstrate self-control, Ability to participate in decision making will improve, Ability to verbalize feelings will improve, Ability to disclose and discuss suicidal ideas, Ability to identify and develop effective coping behaviors will improve and Compliance with prescribed medications will improve  Medication Management: RN will administer medications as ordered by provider, will assess and evaluate patient's response and provide education to patient  for prescribed medication. RN will report any adverse and/or side effects to prescribing provider.  Therapeutic Interventions: 1 on 1 counseling sessions, Psychoeducation, Medication administration, Evaluate responses to treatment, Monitor vital signs and CBGs as ordered, Perform/monitor CIWA, COWS, AIMS and Fall Risk screenings as ordered, Perform wound care treatments as ordered.  Evaluation of Outcomes: Not Met   LCSW Treatment Plan for Primary Diagnosis: <principal problem not specified> Long Term Goal(s): Safe transition to appropriate next level of care at discharge, Engage patient in therapeutic group addressing interpersonal concerns.  Short Term Goals: Engage patient in aftercare planning with referrals and resources, Increase social support, Increase ability to appropriately verbalize feelings, Increase emotional regulation, Facilitate acceptance of mental health diagnosis and concerns, Facilitate patient progression through stages of change regarding substance  use diagnoses and concerns, Identify triggers associated with mental health/substance abuse issues and Increase skills for wellness and recovery  Therapeutic Interventions: Assess for all discharge needs, 1 to 1 time with Social worker, Explore available resources and support systems, Assess for adequacy in community support network, Educate family and significant other(s) on suicide prevention, Complete Psychosocial Assessment, Interpersonal group therapy.  Evaluation of Outcomes: Not Met   Progress in Treatment: Attending groups: No. Participating in groups: No. Taking medication as prescribed: No. Toleration medication: No. Family/Significant other contact made: No, will contact:  patient refusing any collateral contacts at this time Patient understands diagnosis: Yes. Discussing patient identified problems/goals with staff: Yes. Medical problems stabilized or resolved: Yes. Denies suicidal/homicidal ideation:  Yes. Issues/concerns per patient self-inventory: No. Other:   New problem(s) identified:None   New Short Term/Long Term Goal(s): Detox, medication stabilization, elimination of SI thoughts, development of comprehensive mental wellness plan.   Patient Goals:"to get back on my medications, detox, and to build a good after care plan"  Discharge Plan or Barriers: Patient plans to return to [place], [place] to follow up with [facility], medication management and therapy services. "  Reason for Continuation of Hospitalization: Anxiety Depression Medication stabilization  Estimated Length of Stay: Monday, 12/01/17  Attendees: Patient: Brandon [name] 11/28/2017 11:42 AM  Physician: Dr. Mallie Darting, MD 11/28/2017 11:42 AM  Nursing: Rise Paganini, RN 11/28/2017 11:42 AM  RN Care Manager: Rhunette Croft 11/28/2017 11:42 AM  Social Worker: Radonna Ricker, [place] 11/28/2017 11:42 AM  Recreational Therapist: X 11/28/2017 11:42 AM  Other: X 11/28/2017 11:42 AM  Other: X 11/28/2017 11:42 AM  Other: Rhunette Croft 11/28/2017 11:42 AM    Scribe for Treatment Team: Marylee Floras, [place] 11/28/2017 11:42 AM

## 2017-11-28 NOTE — Progress Notes (Signed)
Recreation Therapy Notes  Date: 4.12.19 Time: 9:30 a.m. Location: [address] Dayroom   Group Topic: Stress Management   Goal Area(s) Addresses:  Goal 1.1: To reduce stress  -Patient will feel a reduction in stress level  -Patient will understand the importance of stress management  -Patient will participate during stress management group      Intervention: Stress Management  Activity: Meditation- Patients were in a peaceful environment with soft lighting enhancing patients mood. Patients listened to a body scan meditation on the calm app to help decrease tension and stress levels   Education: Stress Management, Discharge Planning.    Education Outcome: Acknowledges edcuation/In group clarification offered/Needs additional education   Clinical Observations/Feedback:: Patient did not attend     Brandon [name], Recreation Therapy Intern   Brandon [name] 11/28/2017 8:43 AM 

## 2017-11-28 NOTE — Progress Notes (Signed)
[facility] MD Progress Note  11/28/2017 10:53 AM Brandon [name]  MRN:  [id number] Subjective:   Principal Problem: <principal problem not specified> Diagnosis: Patient is seen and examined.  Patient's a 42 year old male with a past psychiatric history significant for alcohol dependence with withdrawal with complication, major depression versus bipolar disorder, PTSD.  He also is seen for benzodiazepine use disorder, cocaine use disorder.  He is seen in follow-up.  He is also seen with nursing and social work and treatment team today.  He has less physical complaints today.  He stated he still does not feel well.  He is having episodes of myalgias as well as nausea.  His sleep is been somewhat interrupted.  He has been very med seeking throughout the hospitalization.  He remains somewhat tachycardic during the course of the hospitalization, but his blood pressure is stable.  His liver function enzymes were repeated this a.m., and they continue to go down.  He denied any suicidal ideation today.  His goal is to attempt to get back to the [facility] [place].  He is focused this morning on getting back on proton pump inhibitors for his reflux disease. Patient Active Problem List   Diagnosis Date Noted  . Alcohol dependence with withdrawal with complication (HCC) [F10.239]   . MDD (major depressive disorder), recurrent severe, without psychosis (HCC) [F33.2] 11/26/2017  . PTSD (post-traumatic stress disorder) [F43.10] 10/15/2017  . Bipolar I disorder, current or most recent episode depressed, with psychotic features (HCC) [F31.5] 10/14/2017   Total Time spent with patient: 20 minutes  Past Psychiatric History: See admission H&P  Past Medical History:  Past Medical History:  Diagnosis Date  . Anxiety   . Depression   . Hyperlipidemia   . Substance abuse (HCC)   . Thyroid disease    History reviewed. No pertinent surgical history. Family History:  Family History  Problem Relation Age of  Onset  . Diabetes Father    Family Psychiatric  History: See admission H&P Social History:  Social History   Substance and Sexual Activity  Alcohol Use Yes     Social History   Substance and Sexual Activity  Drug Use Yes  . Types: "Crack" cocaine    Social History   Socioeconomic History  . Marital status: Single    Spouse name: Not on file  . Number of children: Not on file  . Years of education: Not on file  . Highest education level: Not on file  Occupational History  . Not on file  Social Needs  . Financial resource strain: Not on file  . Food insecurity:    Worry: Not on file    Inability: Not on file  . Transportation needs:    Medical: Not on file    Non-medical: Not on file  Tobacco Use  . Smoking status: Current Every Day Smoker    Types: Cigarettes  . Smokeless tobacco: Never Used  Substance and Sexual Activity  . Alcohol use: Yes  . Drug use: Yes    Types: "Crack" cocaine  . Sexual activity: Not Currently  Lifestyle  . Physical activity:    Days per week: Not on file    Minutes per session: Not on file  . Stress: Not on file  Relationships  . Social connections:    Talks on phone: Not on file    Gets together: Not on file    Attends religious service: Not on file    Active member of club or organization:  Not on file    Attends meetings of clubs or organizations: Not on file    Relationship status: Not on file  Other Topics Concern  . Not on file  Social History Narrative  . Not on file   Additional Social History:                         Sleep: Fair  Appetite:  Fair  Current Medications: Current Facility-Administered Medications  Medication Dose Route Frequency Provider Last Rate Last Dose  . alum & mag hydroxide-simeth (MAALOX/MYLANTA) 200-200-20 MG/5ML suspension 30 mL  30 mL Oral Q4H PRN Laveda AbbeParks, Laurie Britton, NP   30 mL at 11/28/17 0814  . ARIPiprazole (ABILIFY) tablet 10 mg  10 mg Oral QHS Antonieta Pertlary, Greg Lawson, MD   10 mg  at 11/27/17 2115  . haloperidol (HALDOL) tablet 5 mg  5 mg Oral Q6H PRN Laveda AbbeParks, Laurie Britton, NP   5 mg at 11/28/17 [id number]   And  . benztropine (COGENTIN) tablet 1 mg  1 mg Oral Q6H PRN Laveda AbbeParks, Laurie Britton, NP   1 mg at 11/28/17 [id number]  . busPIRone (BUSPAR) tablet 10 mg  10 mg Oral BID Laveda AbbeParks, Laurie Britton, NP   10 mg at 11/28/17 0803  . DULoxetine (CYMBALTA) DR capsule 30 mg  30 mg Oral Daily Antonieta Pertlary, Greg Lawson, MD   30 mg at 11/28/17 0803  . gi cocktail (Maalox,Lidocaine,Donnatal)  30 mL Oral TID PRN Laveda AbbeParks, Laurie Britton, NP      . hydrOXYzine (ATARAX/VISTARIL) tablet 25 mg  25 mg Oral TID PRN Laveda AbbeParks, Laurie Britton, NP   25 mg at 11/26/17 2227  . ibuprofen (ADVIL,MOTRIN) tablet 800 mg  800 mg Oral Q8H PRN Laveda AbbeParks, Laurie Britton, NP   800 mg at 11/28/17 0819  . levothyroxine (SYNTHROID, LEVOTHROID) tablet 88 mcg  88 mcg Oral QAC breakfast Laveda AbbeParks, Laurie Britton, NP   88 mcg at 11/28/17 0631  . lidocaine (LIDODERM) 5 % 1 patch  1 patch Transdermal QHS Laveda AbbeParks, Laurie Britton, NP   1 patch at 11/27/17 2115  . loperamide (IMODIUM) capsule 2-4 mg  2-4 mg Oral PRN Nira ConnBerry, Jason A, NP      . LORazepam (ATIVAN) tablet 1 mg  1 mg Oral Q6H PRN Jackelyn PolingBerry, Jason A, NP      . Melene Muller[START ON 12/01/2017] LORazepam (ATIVAN) tablet 1 mg  1 mg Oral Daily Jackelyn PolingBerry, Jason A, NP       Followed by  . LORazepam (ATIVAN) tablet 1 mg  1 mg Oral TID Nira ConnBerry, Jason A, NP   1 mg at 11/28/17 0807   Followed by  . [START ON 11/29/2017] LORazepam (ATIVAN) tablet 1 mg  1 mg Oral BID Jackelyn PolingBerry, Jason A, NP       Followed by  . [START ON 12/02/2017] LORazepam (ATIVAN) tablet 1 mg  1 mg Oral Daily Nira ConnBerry, Jason A, NP      . magnesium hydroxide (MILK OF MAGNESIA) suspension 30 mL  30 mL Oral Daily PRN Laveda AbbeParks, Laurie Britton, NP      . methocarbamol (ROBAXIN) tablet 500 mg  500 mg Oral Q8H PRN Laveda AbbeParks, Laurie Britton, NP   500 mg at 11/28/17 0223  . nicotine (NICODERM CQ - dosed in mg/24 hours) patch 21 mg  21 mg Transdermal Daily Laveda AbbeParks, Laurie Britton, NP    21 mg at 11/28/17 0805  . ondansetron (ZOFRAN) tablet 4 mg  4 mg Oral Q8H PRN Laveda AbbeParks, Laurie Britton, NP      .  pantoprazole (PROTONIX) EC tablet 40 mg  40 mg Oral Daily Antonieta Pert, MD      . prazosin (MINIPRESS) capsule 3 mg  3 mg Oral QHS Laveda Abbe, NP   3 mg at 11/27/17 2115  . thiamine (VITAMIN B-1) tablet 100 mg  100 mg Oral Daily Laveda Abbe, NP   100 mg at 11/28/17 1610   Or  . thiamine (B-1) injection 100 mg  100 mg Intravenous Daily Laveda Abbe, NP      . traZODone (DESYREL) tablet 300 mg  300 mg Oral QHS Laveda Abbe, NP   300 mg at 11/27/17 2115    Lab Results:  Results for orders placed or performed during the hospital encounter of 11/26/17 (from the past 48 hour(s))  Hepatic function panel     Status: Abnormal   Collection Time: 11/28/17  6:31 AM  Result Value Ref Range   Total Protein 6.1 (L) 6.5 - 8.1 g/dL   Albumin 3.4 (L) 3.5 - 5.0 g/dL   AST 960 (H) 15 - 41 U/L   ALT 155 (H) 17 - 63 U/L   Alkaline Phosphatase 69 38 - 126 U/L   Total Bilirubin 0.4 0.3 - 1.2 mg/dL   Bilirubin, Direct 0.1 0.1 - 0.5 mg/dL   Indirect Bilirubin 0.3 0.3 - 0.9 mg/dL    Comment: Performed at [facility], [address], [place], [place] [address]    Blood Alcohol level:  Lab Results  Component Value Date   ETH <10 11/25/2017   ETH <10 10/13/2017    Metabolic Disorder Labs: No results found for: HGBA1C, MPG No results found for: PROLACTIN No results found for: CHOL, TRIG, HDL, CHOLHDL, VLDL, LDLCALC  Physical Findings: AIMS: Facial and Oral Movements Muscles of Facial Expression: None, normal Lips and Perioral Area: None, normal Jaw: None, normal Tongue: None, normal,Extremity Movements Upper (arms, wrists, hands, fingers): None, normal Lower (legs, knees, ankles, toes): None, normal, Trunk Movements Neck, shoulders, hips: None, normal, Overall Severity Severity of abnormal movements (highest score from  questions above): None, normal Incapacitation due to abnormal movements: None, normal Patient's awareness of abnormal movements (rate only patient's report): No Awareness, Dental Status Current problems with teeth and/or dentures?: No Does patient usually wear dentures?: No  CIWA:  CIWA-Ar Total: 1 COWS:     Musculoskeletal: Strength & Muscle Tone: within normal limits Gait & Station: normal Patient leans: N/A  Psychiatric Specialty Exam: Physical Exam  Nursing note and vitals reviewed. Constitutional: He is oriented to person, place, and time. He appears well-developed and well-nourished.  HENT:  Head: Normocephalic and atraumatic.  Respiratory: Effort normal.  Neurological: He is alert and oriented to person, place, and time.    ROS  Blood pressure (!) 117/93, pulse (!) 112, temperature 97.6 F (36.4 C), temperature source Oral, resp. rate 20, height 6\' 4"  (1.93 m), weight 113.4 kg (250 lb).Body mass index is 30.43 kg/m.  General Appearance: Casual  Eye Contact:  Fair  Speech:  Normal Rate  Volume:  Decreased  Mood:  Dysphoric  Affect:  Congruent  Thought Process:  Coherent  Orientation:  Full (Time, Place, and Person)  Thought Content:  Logical  Suicidal Thoughts:  Yes.  without intent/plan  Homicidal Thoughts:  No  Memory:  Immediate;   Fair  Judgement:  Intact  Insight:  Lacking  Psychomotor Activity:  Restlessness  Concentration:  Concentration: Fair  Recall:  [employer] of Knowledge:  Fair  Language:  Good  Akathisia:  No  Handed:  Right  AIMS (if indicated):     Assets:  Desire for Improvement Resilience  ADL's:  Intact  Cognition:  WNL  Sleep:  Number of Hours: 6.25     Treatment Plan Summary: Daily contact with patient to assess and evaluate symptoms and progress in treatment, Medication management and Plan Patient is seen and examined.  Patient is a 42 year old male with the above-stated past psychiatric history seen in follow-up.  He continues to  have mild to moderate withdrawal symptoms.  We will continue the current line of treatment.  His blood pressure is stable, and he is a bit tachycardic.  He continues on alcohol withdrawal protocol.  He denied any suicidal ideation.  He still dysphoric, but much of this is because of his withdrawal syndrome.  He stated his plan is to return to the [facility] in [place].  Social work will assist with that.  He does request proton pump inhibitors instead of the Pepcid.  And Emina put Protonix 40 mg p.o. daily starting today.  No other changes in his psychiatric medications.  He is going to try and contact the [facility] program and see if he is eligible to return.  From a physical standpoint his liver function enzymes continued to come down, and that is reassuring.  His hepatitis profiles were negative.  Antonieta Pert, MD 11/28/2017, 10:53 AM

## 2017-11-28 NOTE — [facility] Group Notes (Signed)
[facility] Group Notes:  (Nursing/MHT/Case Management/Adjunct)  Date:  11/28/2017  Time:  4:00 pm  Type of Therapy:  Psychoeducational Skills  Participation Level:  Did Not Attend  Participation Quality:    Affect:    Cognitive:    Insight:    Engagement in Group:    Modes of Intervention:    Summary of Progress/Problems:  Earline MayotteKnight, Harlan Ervine Shephard 11/28/2017, 6:28 PM

## 2017-11-28 NOTE — Progress Notes (Signed)
Pt did not attend morning orientation/goals group, remained in bed sleeping. 

## 2017-11-28 NOTE — Progress Notes (Signed)
D:  Patient's self inventory sheet, patient has fair sleep, sleep medication helpful.  Good appetite, low energy level, poor concentration.  Rated depression 8, hopeless 9, anxiety 10.  Withdrawals, tremors, diarrhea, chilling, cravings, cramping, agitation, runny nose, irritability.  SI, contracts for safety.  Physical problems, back, worst pain #8 in past 24 hours.  Pain medication not helpful.  Goal is care plan.  Plans to call [facility].  Needs to be on protonics and will discuss labs with MD.  Does have discharge plans. A:  Medications administered per MD orders.  Emotional support and encouragement given patient. R:  Denied SI and HI while talking to nurse this morning.  Denied A/V hallucinations. Safety maintained with 15 minute checks. Patient has been sleeping throughout the day after getting his pain medications.  Patient did go to dining room for lunch, and then returned to bed.

## 2017-11-29 DIAGNOSIS — E039 Hypothyroidism, unspecified: Secondary | ICD-10-CM

## 2017-11-29 DIAGNOSIS — R450 Nervousness: Secondary | ICD-10-CM

## 2017-11-29 DIAGNOSIS — F1490 Cocaine use, unspecified, uncomplicated: Secondary | ICD-10-CM

## 2017-11-29 DIAGNOSIS — F4310 Post-traumatic stress disorder, unspecified: Secondary | ICD-10-CM

## 2017-11-29 MED ORDER — GI COCKTAIL ~~LOC~~
30.0000 mL | Freq: Three times a day (TID) | ORAL | Status: DC | PRN
  Administered 2017-11-29: 30 mL via ORAL
  Filled 2017-11-29 (×2): qty 30

## 2017-11-29 MED ORDER — HALOPERIDOL 5 MG PO TABS: 5.0000 mg | ORAL_TABLET | Freq: Four times a day (QID) | ORAL | Status: DC | PRN

## 2017-11-29 MED ORDER — DULOXETINE HCL 30 MG PO CPEP
30.0000 mg | ORAL_CAPSULE | Freq: Once | ORAL | Status: AC
  Administered 2017-11-29: 30 mg via ORAL
  Filled 2017-11-29: qty 1

## 2017-11-29 MED ORDER — PRAZOSIN HCL 1 MG PO CAPS
5.0000 mg | ORAL_CAPSULE | Freq: Every day | ORAL | Status: DC
  Administered 2017-11-29: 5 mg via ORAL
  Filled 2017-11-29 (×2): qty 1

## 2017-11-29 MED ORDER — DULOXETINE HCL 60 MG PO CPEP
60.0000 mg | ORAL_CAPSULE | Freq: Every day | ORAL | Status: DC
  Administered 2017-11-30: 60 mg via ORAL
  Filled 2017-11-29 (×2): qty 1

## 2017-11-29 MED ORDER — ALUM & MAG HYDROXIDE-SIMETH 200-200-20 MG/5ML PO SUSP: 30.0000 mL | ORAL | Status: DC | PRN

## 2017-11-29 MED ORDER — BENZTROPINE MESYLATE 1 MG PO TABS: 1.0000 mg | ORAL_TABLET | Freq: Four times a day (QID) | ORAL | Status: DC | PRN

## 2017-11-29 NOTE — Progress Notes (Signed)
D:  Patient's self inventory sheet, patient sleeps good, sleep medication helpful.  Good appetite, normal energy level, good concentration.  Rated depression 5, hopeless 4, anxiety 5.  Denied withdrawals.  Checked cramping, irritability.  Denied SI.  Physical problems, back, worst pain in past 24 hours is #6, lower back.  Pain medication not helping.  Goal is discharge.  Plans to attend groups.  Does have discharge plans. A:  Medications administered per MD orders.  Emotional support and encouragement given patient. R:  Denied SI and HI, contracts for safety.  Denied A/V hallucinations.  Safety maintained with 15 minute checks.

## 2017-11-29 NOTE — Progress Notes (Signed)
[facility] Group Notes:  (Nursing/MHT/Case Management/Adjunct)  Date:  11/29/2017  Time:  2030  Type of Therapy:  wrap up group  Participation Level:  Active  Participation Quality:  Appropriate, Attentive, Sharing and Supportive  Affect:  Anxious and Appropriate  Cognitive:  Appropriate  Insight:  Improving  Engagement in Group:  Engaged  Modes of Intervention:  Clarification, Education and Support  Summary of Progress/Problems: Pt shared that it was good that he talked with and made plans to return to the [facility]. If patient could change any one thing it would be his addiction which began when he was 16 years. Pt is grateful for his loving and supportive family.   Marcille BuffyMcNeil, Kira Hartl S 11/29/2017, 10:26 PM

## 2017-11-29 NOTE — Progress Notes (Signed)
[facility] MD Progress Note  11/29/2017 1:14 PM Brandon [name]  MRN:  [id number]   Subjective: Patient reports that he feels that he is doing very well today.  He states that he is hoping to leave soon.  He had contacted the [facility] in [place] and he is allowed to return.  He reports that they said he could return on any day including the weekend.  He does report that he had some continued nightmares last night and requested that his prazosin be increased to 5 mg as it was before admission.  Also discussed increasing his Cymbalta, he states he had minimal 60 mg in the past and it worked good for him.  He denies any SI/HI/AVH and contracts for safety.  Objective: Patient's chart and findings reviewed and discussed with treatment team.  Patient presents in his room lying in his bed and he is pleasant and cooperative.  He is easily aroused.  Discussed medications and discharge with Dr. Jola Babinski.  Will increase prazosin to 5 mg nightly and his Cymbalta to 60 mg daily.  Provided there are no adverse effects we will discharge patient tomorrow to return to the [facility], which is an [facility] IOP.  Principal Problem: MDD (major depressive disorder), recurrent severe, without psychosis (HCC) Diagnosis:   Patient Active Problem List   Diagnosis Date Noted  . Alcohol dependence with withdrawal with complication (HCC) [F10.239]   . MDD (major depressive disorder), recurrent severe, without psychosis (HCC) [F33.2] 11/26/2017  . PTSD (post-traumatic stress disorder) [F43.10] 10/15/2017  . Bipolar I disorder, current or most recent episode depressed, with psychotic features (HCC) [F31.5] 10/14/2017   Total Time spent with patient: 30 minutes  Past Psychiatric History: See H&P  Past Medical History:  Past Medical History:  Diagnosis Date  . Anxiety   . Depression   . Hyperlipidemia   . Substance abuse (HCC)   . Thyroid disease    History reviewed. No pertinent surgical history. Family History:   Family History  Problem Relation Age of Onset  . Diabetes Father    Family Psychiatric  History: See H&P Social History:  Social History   Substance and Sexual Activity  Alcohol Use Yes     Social History   Substance and Sexual Activity  Drug Use Yes  . Types: "Crack" cocaine    Social History   Socioeconomic History  . Marital status: Single    Spouse name: Not on file  . Number of children: Not on file  . Years of education: Not on file  . Highest education level: Not on file  Occupational History  . Not on file  Social Needs  . Financial resource strain: Not on file  . Food insecurity:    Worry: Not on file    Inability: Not on file  . Transportation needs:    Medical: Not on file    Non-medical: Not on file  Tobacco Use  . Smoking status: Current Every Day Smoker    Types: Cigarettes  . Smokeless tobacco: Never Used  Substance and Sexual Activity  . Alcohol use: Yes  . Drug use: Yes    Types: "Crack" cocaine  . Sexual activity: Not Currently  Lifestyle  . Physical activity:    Days per week: Not on file    Minutes per session: Not on file  . Stress: Not on file  Relationships  . Social connections:    Talks on phone: Not on file    Gets together: Not  on file    Attends religious service: Not on file    Active member of club or organization: Not on file    Attends meetings of clubs or organizations: Not on file    Relationship status: Not on file  Other Topics Concern  . Not on file  Social History Narrative  . Not on file   Additional Social History:        Sleep: Good  Appetite:  Good  Current Medications: Current Facility-Administered Medications  Medication Dose Route Frequency Provider Last Rate Last Dose  . alum & mag hydroxide-simeth (MAALOX/MYLANTA) 200-200-20 MG/5ML suspension 30 mL  30 mL Oral Q4H PRN Antonieta Pertlary, Greg Lawson, MD      . ARIPiprazole (ABILIFY) tablet 10 mg  10 mg Oral QHS Antonieta Pertlary, Greg Lawson, MD   10 mg at 11/28/17 2130   . benztropine (COGENTIN) tablet 1 mg  1 mg Oral Q6H PRN Antonieta Pertlary, Greg Lawson, MD       And  . haloperidol (HALDOL) tablet 5 mg  5 mg Oral Q6H PRN Antonieta Pertlary, Greg Lawson, MD      . busPIRone (BUSPAR) tablet 10 mg  10 mg Oral BID Laveda AbbeParks, Laurie Britton, NP   10 mg at 11/29/17 0831  . [START ON 11/30/2017] DULoxetine (CYMBALTA) DR capsule 60 mg  60 mg Oral Daily Tecumseh Yeagley, Gerlene Burdockravis B, FNP      . gi cocktail (Maalox,Lidocaine,Donnatal)  30 mL Oral TID PRN Antonieta Pertlary, Greg Lawson, MD      . hydrOXYzine (ATARAX/VISTARIL) tablet 25 mg  25 mg Oral TID PRN Laveda AbbeParks, Laurie Britton, NP   25 mg at 11/28/17 2130  . ibuprofen (ADVIL,MOTRIN) tablet 800 mg  800 mg Oral Q8H PRN Laveda AbbeParks, Laurie Britton, NP   800 mg at 11/28/17 0819  . levothyroxine (SYNTHROID, LEVOTHROID) tablet 88 mcg  88 mcg Oral QAC breakfast Laveda AbbeParks, Laurie Britton, NP   88 mcg at 11/29/17 [id number]  . lidocaine (LIDODERM) 5 % 1 patch  1 patch Transdermal QHS Laveda AbbeParks, Laurie Britton, NP   1 patch at 11/28/17 2130  . loperamide (IMODIUM) capsule 2-4 mg  2-4 mg Oral PRN Nira ConnBerry, Jason A, NP      . LORazepam (ATIVAN) tablet 1 mg  1 mg Oral Q6H PRN Jackelyn PolingBerry, Jason A, NP      . Melene Muller[START ON 12/01/2017] LORazepam (ATIVAN) tablet 1 mg  1 mg Oral Daily Jackelyn PolingBerry, Jason A, NP       Followed by  . LORazepam (ATIVAN) tablet 1 mg  1 mg Oral BID Jackelyn PolingBerry, Jason A, NP       Followed by  . [START ON 12/02/2017] LORazepam (ATIVAN) tablet 1 mg  1 mg Oral Daily Nira ConnBerry, Jason A, NP      . magnesium hydroxide (MILK OF MAGNESIA) suspension 30 mL  30 mL Oral Daily PRN Laveda AbbeParks, Laurie Britton, NP      . methocarbamol (ROBAXIN) tablet 500 mg  500 mg Oral Q8H PRN Laveda AbbeParks, Laurie Britton, NP   500 mg at 11/29/17 0836  . nicotine (NICODERM CQ - dosed in mg/24 hours) patch 21 mg  21 mg Transdermal Daily Laveda AbbeParks, Laurie Britton, NP   21 mg at 11/29/17 [id number]  . ondansetron (ZOFRAN) tablet 4 mg  4 mg Oral Q8H PRN Laveda AbbeParks, Laurie Britton, NP      . pantoprazole (PROTONIX) EC tablet 40 mg  40 mg Oral Daily Antonieta Pertlary, Greg Lawson, MD    40 mg at 11/29/17 [id number]  . prazosin (MINIPRESS) capsule 5 mg  5  mg Oral QHS Odalis Jordan, Gerlene Burdock, FNP      . thiamine (VITAMIN B-1) tablet 100 mg  100 mg Oral Daily Laveda Abbe, NP   100 mg at 11/29/17 1610   Or  . thiamine (B-1) injection 100 mg  100 mg Intravenous Daily Laveda Abbe, NP      . traZODone (DESYREL) tablet 300 mg  300 mg Oral QHS Laveda Abbe, NP   300 mg at 11/28/17 2130    Lab Results:  Results for orders placed or performed during the hospital encounter of 11/26/17 (from the past 48 hour(s))  Hepatic function panel     Status: Abnormal   Collection Time: 11/28/17  6:31 AM  Result Value Ref Range   Total Protein 6.1 (L) 6.5 - 8.1 g/dL   Albumin 3.4 (L) 3.5 - 5.0 g/dL   AST 960 (H) 15 - 41 U/L   ALT 155 (H) 17 - 63 U/L   Alkaline Phosphatase 69 38 - 126 U/L   Total Bilirubin 0.4 0.3 - 1.2 mg/dL   Bilirubin, Direct 0.1 0.1 - 0.5 mg/dL   Indirect Bilirubin 0.3 0.3 - 0.9 mg/dL    Comment: Performed at [facility], [address], [place], [place] [address]    Blood Alcohol level:  Lab Results  Component Value Date   ETH <10 11/25/2017   ETH <10 10/13/2017    Metabolic Disorder Labs: No results found for: HGBA1C, MPG No results found for: PROLACTIN No results found for: CHOL, TRIG, HDL, CHOLHDL, VLDL, LDLCALC  Physical Findings: AIMS: Facial and Oral Movements Muscles of Facial Expression: None, normal Lips and Perioral Area: None, normal Jaw: None, normal Tongue: None, normal,Extremity Movements Upper (arms, wrists, hands, fingers): None, normal Lower (legs, knees, ankles, toes): None, normal, Trunk Movements Neck, shoulders, hips: None, normal, Overall Severity Severity of abnormal movements (highest score from questions above): None, normal Incapacitation due to abnormal movements: None, normal Patient's awareness of abnormal movements (rate only patient's report): No Awareness, Dental Status Current problems  with teeth and/or dentures?: No Does patient usually wear dentures?: No  CIWA:  CIWA-Ar Total: 1 COWS:  COWS Total Score: 2  Musculoskeletal: Strength & Muscle Tone: within normal limits Gait & Station: normal Patient leans: N/A  Psychiatric Specialty Exam: Physical Exam  Nursing note and vitals reviewed. Constitutional: He is oriented to person, place, and time. He appears well-developed and well-nourished.  Cardiovascular: Normal rate.  Respiratory: Effort normal.  Musculoskeletal: Normal range of motion.  Neurological: He is alert and oriented to person, place, and time.  Skin: Skin is warm.    Review of Systems  Constitutional: Negative.   HENT: Negative.   Eyes: Negative.   Respiratory: Negative.   Cardiovascular: Negative.   Gastrointestinal: Negative.   Genitourinary: Negative.   Musculoskeletal: Negative.   Skin: Negative.   Neurological: Negative.   Endo/Heme/Allergies: Negative.   Psychiatric/Behavioral: Positive for depression and substance abuse. Negative for hallucinations and suicidal ideas. The patient is nervous/anxious.     Blood pressure 125/79, pulse 97, temperature 97.8 F (36.6 C), resp. rate 20, height 6\' 4"  (1.93 m), weight 113.4 kg (250 lb), SpO2 96 %.Body mass index is 30.43 kg/m.  General Appearance: Casual  Eye Contact:  Good  Speech:  Clear and Coherent and Normal Rate  Volume:  Normal  Mood:  Euthymic he reports mild depression and anxiety  Affect:  Congruent  Thought Process:  Goal Directed and Descriptions of Associations: Intact  Orientation:  Full (Time, Place, and Person)  Thought Content:  WDL  Suicidal Thoughts:  No  Homicidal Thoughts:  No  Memory:  Immediate;   Good Recent;   Good Remote;   Good  Judgement:  Fair  Insight:  Good  Psychomotor Activity:  Normal  Concentration:  Concentration: Good and Attention Span: Good  Recall:  Good  Fund of Knowledge:  Good  Language:  Good  Akathisia:  No  Handed:  Right  AIMS (if  indicated):     Assets:  Communication Skills Desire for Improvement Financial Resources/Insurance Housing Physical Health Social Support Transportation  ADL's:  Intact  Cognition:  WNL  Sleep:  Number of Hours: 6   Problems addressed MDD severe recurrent Alcohol dependence PTSD  Treatment Plan Summary: Daily contact with patient to assess and evaluate symptoms and progress in treatment, Medication management and Plan ia to: -Increase prazosin 5 mg p.o. nightly for PTSD nightmares -Increase Cymbalta 60 mg p.o. daily for mood stability -Continue Abilify 10 mg p.o. daily for mood stability -Continue BuSpar 10 mg twice daily for anxiety -Continue Ativan CIWA protocol -Continue trazodone 300 mg nightly for sleep and mood stability -Continue Synthroid 88 mcg p.o. daily for hypothyroidism -Encourage group therapy participation  Maryfrances Bunnell, FNP 11/29/2017, 1:14 PM

## 2017-11-29 NOTE — Progress Notes (Signed)
Patient hopes to discharge tomorrow.  Will discuss discharge plans with MD.

## 2017-11-29 NOTE — [facility] Group Notes (Signed)
LCSW Group Therapy Note 11/29/2017 1:39 PM  Type of Therapy/Topic: Group Therapy: Emotion Regulation  Participation Level: Did Not Attend   Description of Group:  The purpose of this group is to assist patients in learning to regulate negative emotions and experience positive emotions. Patients will be guided to discuss ways in which they have been vulnerable to their negative emotions. These vulnerabilities will be juxtaposed with experiences of positive emotions or situations, and patients will be challenged to use positive emotions to combat negative ones. Special emphasis will be placed on coping with negative emotions in conflict situations, and patients will process healthy conflict resolution skills.  Therapeutic Goals: 1. Patient will identify two positive emotions or experiences to reflect on in order to balance out negative emotions 2. Patient will label two or more emotions that they find the most difficult to experience 3. Patient will demonstrate positive conflict resolution skills through discussion and/or role plays  Summary of Patient Progress:  Invited, chose not to attend.     Therapeutic Modalities:  Cognitive Behavioral Therapy Feelings Identification Dialectical Behavioral Therapy   Alcario DroughtJolan Tiaria Biby LCSWA Clinical Social Worker

## 2017-11-29 NOTE — [facility] Group Notes (Signed)
[facility] Group Notes:  (Nursing/MHT/Case Management/Adjunct)  Date:  11/29/2017  Time:  1:15 pm  Type of Therapy:  Nurse Education  Participation Level:  Active  Participation Quality:  Appropriate  Affect:  Appropriate  Cognitive:  Appropriate  Insight:  Appropriate  Engagement in Group:  Engaged  Modes of Intervention:  Discussion  Summary of Progress/Problems:  Patient participated appropriately with thoughtful insight.   Brandon [name], Brandon [name] 11/29/2017, 2:46 PM

## 2017-11-29 NOTE — Plan of Care (Signed)
Nurse discussed discharge, depression, anxiety, coping skills with patient.

## 2017-11-29 NOTE — Progress Notes (Signed)
Patient has been up and active on the unit, attended group this evening and has voiced no complaints. He is looking forward to discharging on tomorrow. Patient currently denies having pain, -si/hi/a/v hall. Support and encouragement offered, safety maintained on unit, will continue to monitor.

## 2017-11-30 MED ORDER — PRAZOSIN HCL 5 MG PO CAPS: 5.0000 mg | ORAL_CAPSULE | Freq: Every day | ORAL | 0 refills | Status: DC

## 2017-11-30 MED ORDER — ARIPIPRAZOLE 10 MG PO TABS: 10.0000 mg | ORAL_TABLET | Freq: Every day | ORAL | 0 refills | Status: DC

## 2017-11-30 MED ORDER — DULOXETINE HCL 60 MG PO CPEP: 60.0000 mg | ORAL_CAPSULE | Freq: Every day | ORAL | 0 refills | Status: DC

## 2017-11-30 MED ORDER — TRAZODONE HCL 300 MG PO TABS: 300.0000 mg | ORAL_TABLET | Freq: Every day | ORAL | 0 refills | Status: DC

## 2017-11-30 MED ORDER — HYDROXYZINE HCL 25 MG PO TABS: 25.0000 mg | ORAL_TABLET | Freq: Three times a day (TID) | ORAL | 0 refills | Status: DC | PRN

## 2017-11-30 MED ORDER — BUSPIRONE HCL 10 MG PO TABS: 10.0000 mg | ORAL_TABLET | Freq: Two times a day (BID) | ORAL | 0 refills | Status: DC

## 2017-11-30 MED ORDER — LEVOTHYROXINE SODIUM 88 MCG PO TABS: 88.0000 ug | ORAL_TABLET | Freq: Every day | ORAL | 0 refills | Status: AC

## 2017-11-30 NOTE — [facility] Suicide Risk Assessment (Signed)
[facility]   Principal Problem: MDD (major depressive disorder), recurrent severe, without psychosis (HCC) Discharge Diagnoses:  Patient Active Problem List   Diagnosis Date Noted  . Alcohol dependence with withdrawal with complication (HCC) [F10.239]   . MDD (major depressive disorder), recurrent severe, without psychosis (HCC) [F33.2] 11/26/2017  . PTSD (post-traumatic stress disorder) [F43.10] 10/15/2017  . Bipolar I disorder, current or most recent episode depressed, with psychotic features (HCC) [F31.5] 10/14/2017    Total Time spent with patient: 30 minutes  Musculoskeletal: Strength & Muscle Tone: within normal limits Gait & Station: normal Patient leans: N/A  Psychiatric Specialty Exam: Review of Systems  All other systems reviewed and are negative.   Blood pressure 133/79, pulse (!) 102, temperature 97.8 F (36.6 C), resp. rate 20, height 6\' 4"  (1.93 m), weight 113.4 kg (250 lb), SpO2 96 %.Body mass index is 30.43 kg/m.  General Appearance: Casual  Eye Contact::  Good  Speech:  Clear and Coherent409  Volume:  Normal  Mood:  Euthymic  Affect:  Appropriate  Thought Process:  Coherent  Orientation:  Full (Time, Place, and Person)  Thought Content:  Logical  Suicidal Thoughts:  No  Homicidal Thoughts:  No  Memory:  Immediate;   Good  Judgement:  Good  Insight:  Fair  Psychomotor Activity:  Normal  Concentration:  Good  Recall:  Good  Fund of Knowledge:Good  Language: Good  Akathisia:  No  Handed:  Right  AIMS (if indicated):     Assets:  Communication Skills Desire for Improvement Talents/Skills  Sleep:  Number of Hours: 6  Cognition: WNL  ADL's:  Intact   Mental Status Per Nursing Assessment::   On Admission:     Demographic Factors:  Male, Caucasian, Low socioeconomic status and Unemployed  Loss Factors: Financial problems/change in socioeconomic status  Historical Factors: Impulsivity  Risk Reduction Factors:    Positive therapeutic relationship  Continued Clinical Symptoms:  Alcohol/Substance Abuse/Dependencies  Cognitive Features That Contribute To Risk:  None    Suicide Risk:  Minimal: No identifiable suicidal ideation.  Patients presenting with no risk factors but with morbid ruminations; may be classified as minimal risk based on the severity of the depressive symptoms  Follow-up Information    [facility].   Why:  Appointment 12/08/17 at 2:00pm. Contact information: [address], [place], [place] [address] Phone: [phone] Fax: [phone]          Plan Of Care/Follow-up recommendations:  Activity:  ad lib  Antonieta PertGreg Lawson Jos Cygan, MD 11/30/2017, 7:46 AM

## 2017-11-30 NOTE — Discharge Summary (Signed)
Physician Discharge Summary Note  Patient:  Brandon ChromanKori [name] is an 42 y.o., male MRN:  [id number] DOB:  1976/04/06 Patient phone:  [phone] (home)  Patient address:   [id number] Brandon [name] [id number],  Total Time spent with patient: 20 minutes  Date of Admission:  11/26/2017 Date of Discharge: 11/30/2017  Reason for Admission: Per assessment note-  Patient is a 42 year old male with a reported past psychiatric history significant for posttraumatic stress disorder, bipolar disorder, major depression, alcohol use disorder, benzodiazepine use disorder, cocaine use disorder, methamphetamine use disorder.  He presented to the local emergency room today requesting assistance to get off drugs.  The patient stated that he had been recently hospitalized [facility] the [facility].  He was placed on Cymbalta, Abilify, prazosin, trazodone and BuSpar at that time.  When he left the behavioral health Hospital he went to a residential substance abuse treatment program in [place].  He had been there since discharge.  He stated that he came back to [place] to get his car.  He stated that once he got here he relapsed on drugs, alcohol and benzodiazepines.  He stated he had used methamphetamines in the past but none recently.  He was quite uncomfortable at the time that I evaluated the patient.  He stated he been drinking alcohol "constantly".  He stated that prior to his hospitalization earlier this year he had been at [facility] issues.  He was admitted to the hospital for evaluation and stabilization.  In the emergency room his liver function enzymes were significantly elevated, and they repeated their labs and had somewhat decreased.  He had an ultrasound that basically just showed fatty infiltration    Principal Problem: MDD (major depressive disorder), recurrent severe, without psychosis [facility](HCC) Discharge Diagnoses: Patient Active Problem List   Diagnosis Date Noted  . Alcohol  dependence with withdrawal with complication (HCC) [F10.239]   . MDD (major depressive disorder), recurrent severe, without psychosis (HCC) [F33.2] 11/26/2017  . PTSD (post-traumatic stress disorder) [F43.10] 10/15/2017  . Bipolar I disorder, current or most recent episode depressed, with psychotic features (HCC) [F31.5] 10/14/2017    Past Psychiatric History:   Past Medical History:  Past Medical History:  Diagnosis Date  . Anxiety   . Depression   . Hyperlipidemia   . Substance abuse (HCC)   . Thyroid disease    History reviewed. No pertinent surgical history. Family History:  Family History  Problem Relation Age of Onset  . Diabetes Father    Family Psychiatric  History:  Social History:  Social History   Substance and Sexual Activity  Alcohol Use Yes     Social History   Substance and Sexual Activity  Drug Use Yes  . Types: "Crack" cocaine    Social History   Socioeconomic History  . Marital status: Single    Spouse name: Not on file  . Number of children: Not on file  . Years of education: Not on file  . Highest education level: Not on file  Occupational History  . Not on file  Social Needs  . Financial resource strain: Not on file  . Food insecurity:    Worry: Not on file    Inability: Not on file  . Transportation needs:    Medical: Not on file    Non-medical: Not on file  Tobacco Use  . Smoking status: Current Every Day Smoker    Types: Cigarettes  . Smokeless tobacco: Never Used  Substance and Sexual Activity  .  Alcohol use: Yes  . Drug use: Yes    Types: "Crack" cocaine  . Sexual activity: Not Currently  Lifestyle  . Physical activity:    Days per week: Not on file    Minutes per session: Not on file  . Stress: Not on file  Relationships  . Social connections:    Talks on phone: Not on file    Gets together: Not on file    Attends religious service: Not on file    Active member of club or organization: Not on file    Attends meetings  of clubs or organizations: Not on file    Relationship status: Not on file  Other Topics Concern  . Not on file  Social History Narrative  . Not on file    Hospital Course:  Brandon [name] was admitted for MDD (major depressive disorder), recurrent severe, without psychosis (HCC) and crisis management.  Pt was treated discharged with the medications listed below under Medication List.  Medical problems were identified and treated as needed.  [name]  Improvement was monitored by observation and Brandon [name] 's daily report of symptom reduction.  Emotional and mental status was monitored by daily self-inventory reports completed by Brandon [name] and clinical staff.         Shady Kovich was evaluated by the treatment team for stability and plans for continued recovery upon discharge. Amanuel Eichenberger 's motivation was an integral factor for scheduling further treatment. Employment, transportation, bed availability, health status, family support, and any pending legal issues were also considered during hospital stay. Pt was offered further treatment options upon discharge including but not limited to Residential, Intensive Outpatient, and Outpatient treatment.  [name] will follow up a the [facility] in [place].   Upon completion of this admission the patient was both mentally and medically stable for discharge denying suicidal or homicidal ideation.    [name] responded well to treatment with Abilify 10 mg, Buspar  And Cymbalta  60 mg without adverse effects. . Pt demonstrated improvement without reported or observed adverse effects to the point of stability appropriate for outpatient management. Pertinent labs include: AST/ALT 451/155, CMP  elevation  for which outpatient follow-up is necessary for lab recheck as mentioned below. Reviewed CBC, CMP, BAL, and UDS; all unremarkable aside from noted exceptions.   Physical  Findings: AIMS: Facial and Oral Movements Muscles of Facial Expression: None, normal Lips and Perioral Area: None, normal Jaw: None, normal Tongue: None, normal,Extremity Movements Upper (arms, wrists, hands, fingers): None, normal Lower (legs, knees, ankles, toes): None, normal, Trunk Movements Neck, shoulders, hips: None, normal, Overall Severity Severity of abnormal movements (highest score from questions above): None, normal Incapacitation due to abnormal movements: None, normal Patient's awareness of abnormal movements (rate only patient's report): No Awareness, Dental Status Current problems with teeth and/or dentures?: No Does patient usually wear dentures?: No  CIWA:  CIWA-Ar Total: 0 COWS:  COWS Total Score: 2  Musculoskeletal: Strength & Muscle Tone: within normal limits Gait & Station: normal Patient leans: N/A  Psychiatric Specialty Exam: See SRA by MD  Physical Exam  Vitals reviewed. Cardiovascular: Normal rate.  Neurological: He is alert.  Psychiatric: He has a normal mood and affect. His behavior is normal.    Review of Systems  Psychiatric/Behavioral: Negative for depression (stable) and suicidal ideas. The patient is not nervous/anxious.   All other systems reviewed and are negative.   Blood pressure 140/64, pulse (!) 119, temperature  98.1 F (36.7 C), temperature source Oral, resp. rate 20, height 6\' 4"  (1.93 m), weight 113.4 kg (250 lb), SpO2 96 %.Body mass index is 30.43 kg/m.   Have you used any form of tobacco in the last 30 days? (Cigarettes, Smokeless Tobacco, Cigars, and/or Pipes): Yes  Has this patient used any form of tobacco in the last 30 days? (Cigarettes, Smokeless Tobacco, Cigars, and/or Pipes) Yes, Yes, A prescription for an FDA-approved tobacco cessation medication was offered at discharge and the patient refused  Blood Alcohol level:  Lab Results  Component Value Date   ETH <10 11/25/2017   ETH <10 10/13/2017    Metabolic Disorder  Labs:  No results found for: HGBA1C, MPG No results found for: PROLACTIN No results found for: CHOL, TRIG, HDL, CHOLHDL, VLDL, LDLCALC  See Psychiatric Specialty Exam and Suicide Risk Assessment completed by Attending Physician prior to discharge.  Discharge destination:  Home  Is patient on multiple antipsychotic therapies at discharge:  No   Has Patient had three or more failed trials of antipsychotic monotherapy by history:  No  Recommended Plan for Multiple Antipsychotic Therapies: NA   Allergies as of 11/30/2017   No Known Allergies     Medication List    STOP taking these medications   nicotine 21 mg/24hr patch Commonly known as:  NICODERM CQ - dosed in mg/24 hours   OLANZapine 10 MG tablet Commonly known as:  ZYPREXA   omeprazole 20 MG capsule Commonly known as:  PRILOSEC   ondansetron 4 MG tablet Commonly known as:  ZOFRAN     TAKE these medications     Indication  ARIPiprazole 10 MG tablet Commonly known as:  ABILIFY Take 1 tablet (10 mg total) by mouth at bedtime. For mood control What changed:    medication strength  how much to take  when to take this  additional instructions  Indication:  mood stability   busPIRone 10 MG tablet Commonly known as:  BUSPAR Take 1 tablet (10 mg total) by mouth 2 (two) times daily. For mood control What changed:  additional instructions  Indication:  mood stability   DULoxetine 60 MG capsule Commonly known as:  CYMBALTA Take 1 capsule (60 mg total) by mouth daily. For mood control What changed:    medication strength  how much to take  additional instructions  Indication:  mood stability   hydrOXYzine 25 MG tablet Commonly known as:  ATARAX/VISTARIL Take 1 tablet (25 mg total) by mouth 3 (three) times daily as needed for anxiety. What changed:    medication strength  how much to take  when to take this  Indication:  Feeling Anxious   levothyroxine 88 MCG tablet Commonly known as:  SYNTHROID,  LEVOTHROID Take 1 tablet (88 mcg total) by mouth daily before breakfast. For hypothyroidism Start taking on:  12/01/2017 What changed:  additional instructions  Indication:  Underactive Thyroid   prazosin 5 MG capsule Commonly known as:  MINIPRESS Take 1 capsule (5 mg total) by mouth at bedtime. For nightmares What changed:    medication strength  how much to take  Indication:  PTSD nightmares   trazodone 300 MG tablet Commonly known as:  DESYREL Take 1 tablet (300 mg total) by mouth at bedtime. For sleep  Indication:  Trouble Sleeping      Follow-up Information    [facility]. Go on 12/08/2017.   Why:  Appointment 12/08/17 at 2:00pm. Contact information: [address], [place], [place] [address] Phone: [phone] Fax:  [phone]          Follow-up recommendations:  Activity:  as tolerated Diet:  heart healthy  Comments:  Take all medications as prescribed. Keep all follow-up appointments as scheduled.  Do not consume alcohol or use illegal drugs while on prescription medications. Report any adverse effects from your medications to your primary care provider promptly.  In the event of recurrent symptoms or worsening symptoms, call 911, a crisis hotline, or go to the nearest emergency department for evaluation.   Signed: Oneta Rack, NP 11/30/2017, 8:21 AM

## 2017-11-30 NOTE — Progress Notes (Signed)
  [facility] Discharge Plan :  Will you be returning to the same living situation after discharge:  Yes,  to [place] At discharge, do you have transportation home?: Yes,  has a truck Do you have the ability to pay for your medications: No.  No income, no insurance, will seek help from follow-up provider  Release of information consent forms completed and turned in to Medical Records by CSW.   Patient to Follow up at: Follow-up Information    [facility]. Go on 12/08/2017.   Why:  Appointment 12/08/17 at 2:00pm. Contact information: [address], [place], [place] [address] Phone: [phone] Fax: [phone]          Next level of care provider has access to [facility] Link:no  Safety Planning and Suicide Prevention discussed: No.  Patient refused family contact  Have you used any form of tobacco in the last 30 days? (Cigarettes, Smokeless Tobacco, Cigars, and/or Pipes): Yes  Has patient been referred to the Quitline?: Patient refused referral  Patient has been referred for addiction treatment: Yes  Lynnell ChadMareida J Grossman-Orr, LCSW 11/30/2017, 8:24 AM

## 2017-11-30 NOTE — Progress Notes (Signed)
D:  Patient's self inventory sheet, patient has fair sleep, sleep medication not helpful.  Good appetite, normal energy level, good concentration.  Rated depression, hopeless and anxiety 5.  Denied withdrawals.  Denied SI.  Denied physical problems.  Denied physical pain.  Goal is discharge.  Does have discharge plans. A:  Medications administered per MD orders.  Emotional support and encouragement given patient. R:  Denied SI and HI, contracts for safety.  Denied A/V hallucinations.  Safety maintained with 15 minute checks.

## 2017-11-30 NOTE — [facility] Group Notes (Signed)
[facility] LCSW Group Therapy Note  Date/Time: 11/30/17, 0900  Type of Therapy/Topic:  Group Therapy:  Feelings about Diagnosis  Participation Level:  Did Not Attend   Mood:   Description of Group:    This group will allow patients to explore their thoughts and feelings about diagnoses they have received. Patients will be guided to explore their level of understanding and acceptance of these diagnoses. Facilitator will encourage patients to process their thoughts and feelings about the reactions of others to their diagnosis, and will guide patients in identifying ways to discuss their diagnosis with significant others in their lives. This group will be process-oriented, with patients participating in exploration of their own experiences as well as giving and receiving support and challenge from other group members.   Therapeutic Goals: 1. Patient will demonstrate understanding of diagnosis as evidence by identifying two or more symptoms of the disorder:  2. Patient will be able to express two feelings regarding the diagnosis 3. Patient will demonstrate ability to communicate their needs through discussion and/or role plays  Summary of Patient Progress:        Therapeutic Modalities:   Cognitive Behavioral Therapy Brief Therapy Feelings Identification   Greg Shanine Kreiger, LCSW 

## 2017-11-30 NOTE — Progress Notes (Signed)
Discharge Note:  Patient discharged.  His car in parking lot.  Patient denied SI and HI.  Denied A/V hallucinations.  Suicide prevention information given and discussed with patient who stated he understood and had no questions.  Patient stated he received all his belongings, clothing, toiletries, prescriptions, medications, etc.  Patient stated he appreciated all assistance received [facility].  All required discharge information given to patient at discharge.

## 2018-12-23 IMAGING — US US ABDOMEN COMPLETE
1 series · 13 of 25 positions shown · non-contrast
Comparison: None.

CLINICAL DATA: Increased liver function tests and abdominal pain
over the last 2 days.

EXAM:
ABDOMEN ULTRASOUND COMPLETE

[Series 1: us abdomen complete · 0.22mm/px · 13 of 84 slices shown]
[im 1/84]
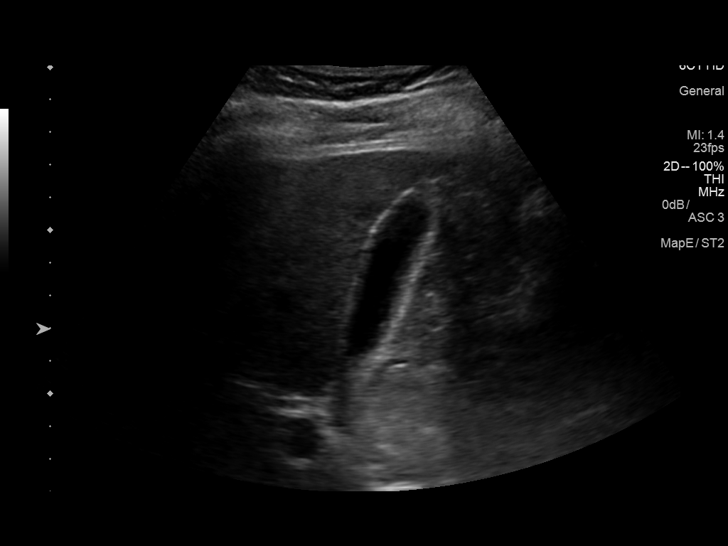
[im 7/84]
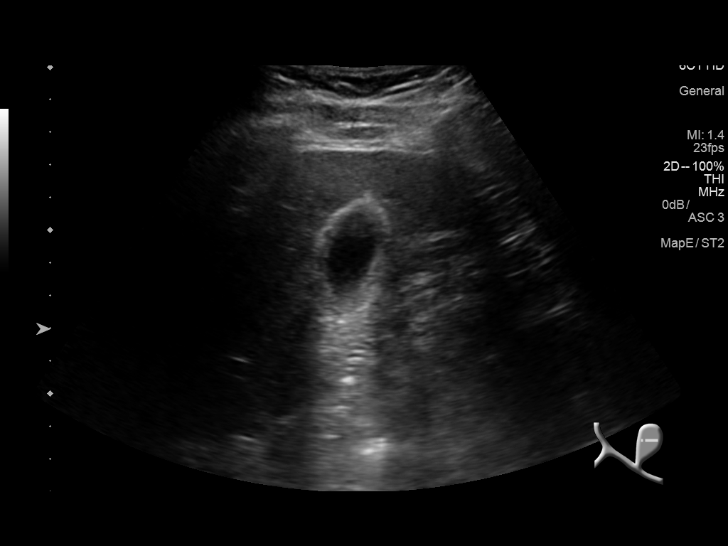
[im 14/84]
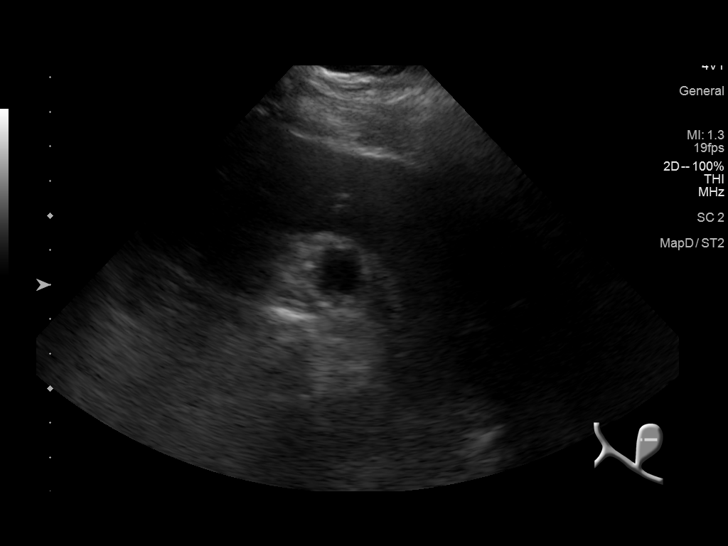
[im 21/84]
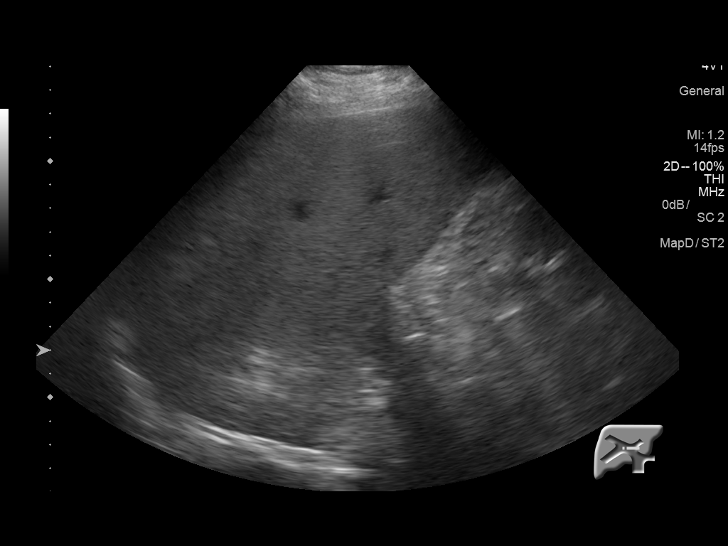
[im 28/84]
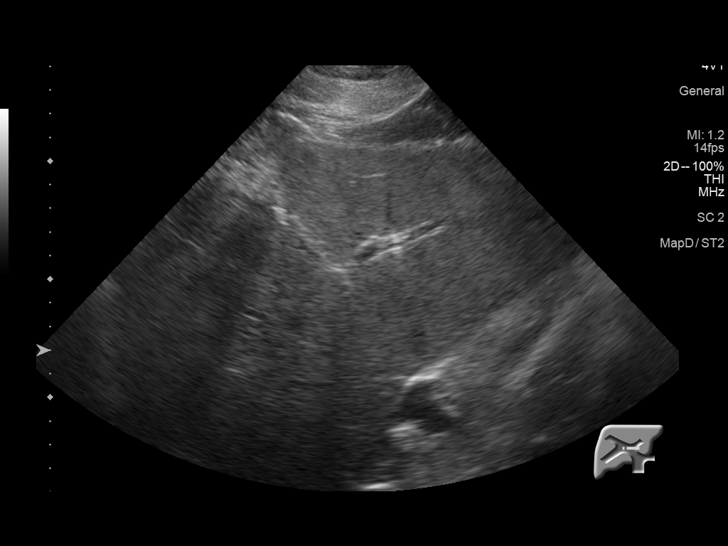
[im 35/84]
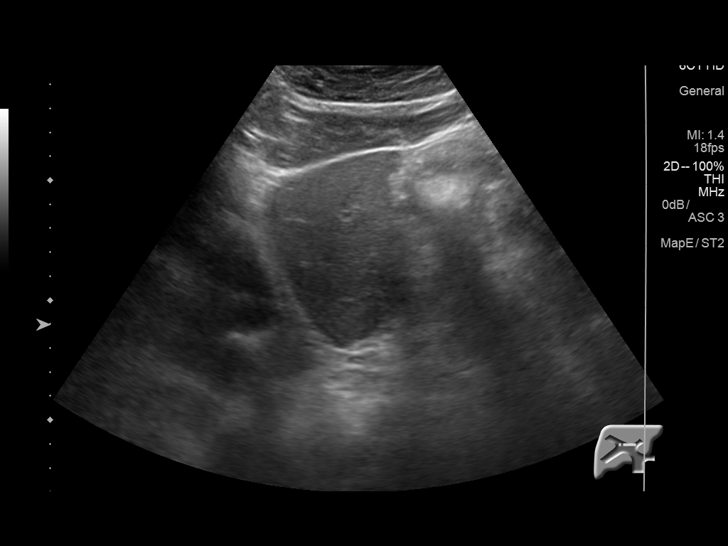
[im 42/84]
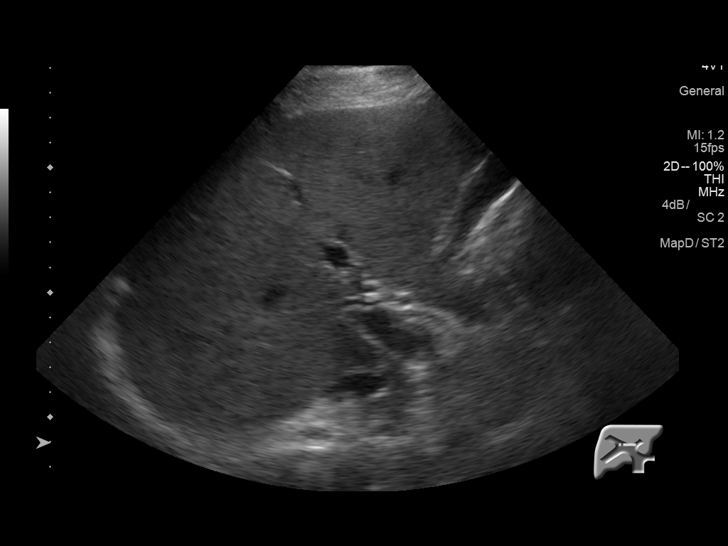
[im 49/84]
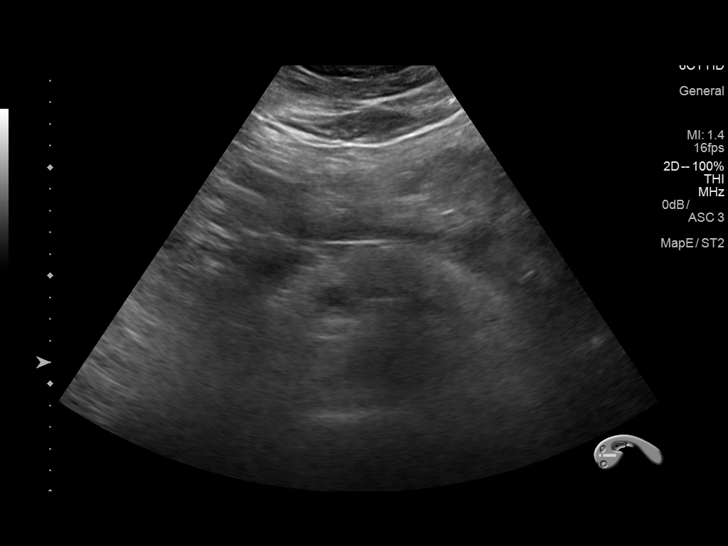
[im 56/84]
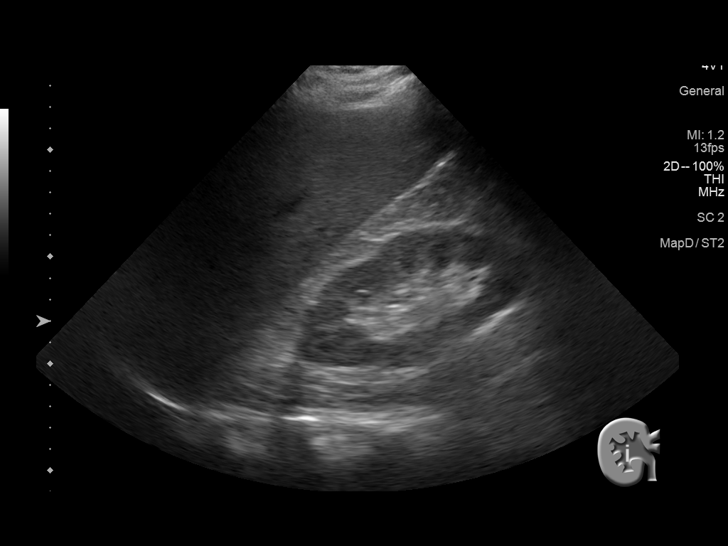
[im 63/84]
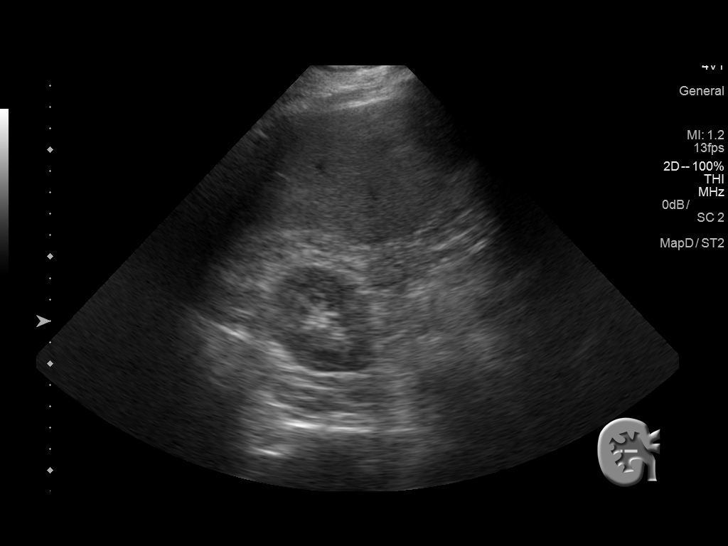
[im 70/84]
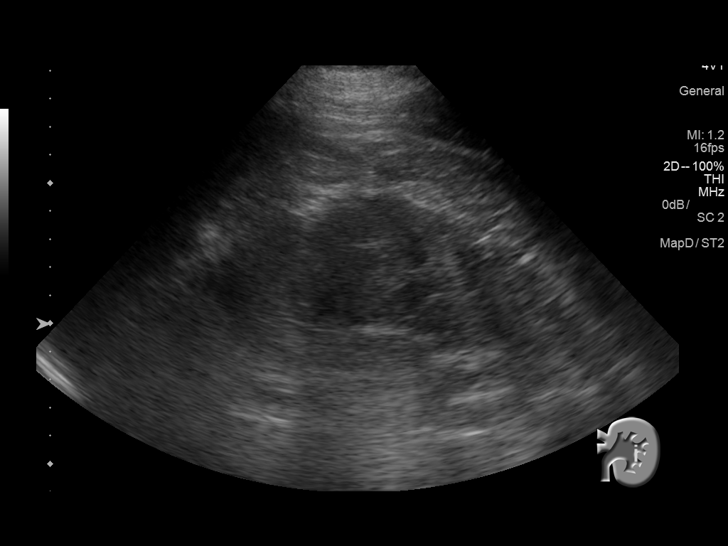
[im 77/84]
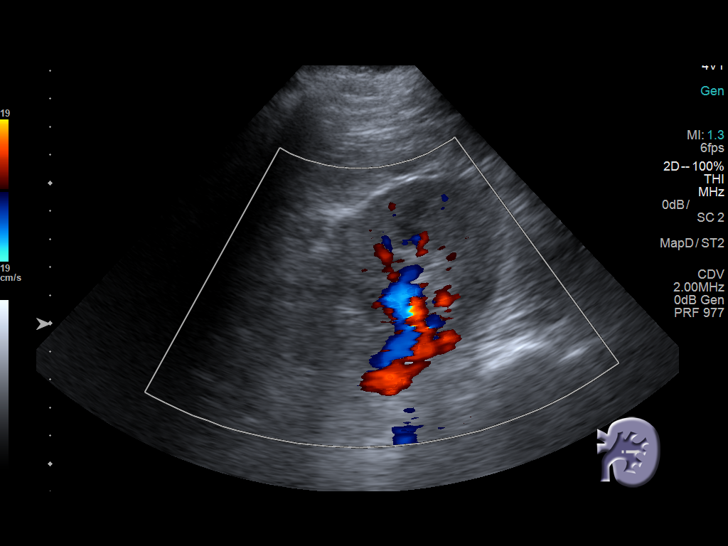
[im 84/84]
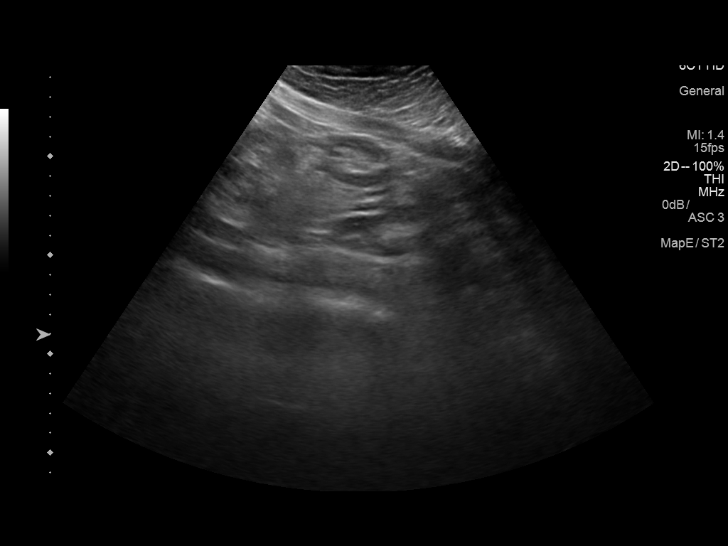

[13 of 25 positions shown; findings below may reference images not displayed]

FINDINGS: Gallbladder: Gallbladder wall thickening with areas of ring down
artifact suggesting the diagnosis of adenomyomatosis. No stones.
Negative Murphy sign. No surrounding fluid.

Common bile duct: Diameter: 3 mm common normal

Liver: Increased echogenicity suggesting fatty change. No focal
lesion. Portal vein is patent on color Doppler imaging with normal
direction of blood flow towards the liver.

IVC: No abnormality visualized.

Pancreas: Visualized portion unremarkable.

Spleen: Size and appearance within normal limits.

Right Kidney: Length: 12.1 cm. Echogenicity within normal limits. No
mass or hydronephrosis visualized.

Left Kidney: Length: 12.8 cm. Echogenicity within normal limits. No
mass or hydronephrosis visualized.

Abdominal aorta: Poorly seen because of overlying bowel gas.
Portions visualized appear normal.

Other findings: No ascites.
IMPRESSION: Fatty change of the liver without focal lesion.

Mild gallbladder wall thickening with ring down artifact. This can
be seen and adenomyomatosis of the gallbladder. No gallstones.
Patient did not have a positive Murphy sign.

## 2021-03-11 ENCOUNTER — Emergency Department (HOSPITAL_COMMUNITY)
Admission: EM | Admit: 2021-03-11 | Discharge: 2021-03-12 | Disposition: A | Discharge status: Other Acute Inpatient Hospital | Payer: BC Managed Care – PPO | Source: Home / Self Care | Attending: Emergency Medicine | Admitting: Emergency Medicine

## 2021-03-11 ENCOUNTER — Other Ambulatory Visit: Payer: Self-pay

## 2021-03-11 ENCOUNTER — Encounter (HOSPITAL_COMMUNITY): Payer: Self-pay

## 2021-03-11 ENCOUNTER — Ambulatory Visit (HOSPITAL_COMMUNITY)
Admission: RE | Admit: 2021-03-11 | Discharge: 2021-03-11 | Disposition: A | Discharge status: Home / Self Care | Payer: BC Managed Care – PPO | Source: Home / Self Care | Attending: Psychiatry | Admitting: Psychiatry

## 2021-03-11 DIAGNOSIS — F17210 Nicotine dependence, cigarettes, uncomplicated: Secondary | ICD-10-CM | POA: Insufficient documentation

## 2021-03-11 DIAGNOSIS — F1420 Cocaine dependence, uncomplicated: Secondary | ICD-10-CM | POA: Insufficient documentation

## 2021-03-11 DIAGNOSIS — Y906 Blood alcohol level of 120-199 mg/100 ml: Secondary | ICD-10-CM | POA: Insufficient documentation

## 2021-03-11 DIAGNOSIS — F1020 Alcohol dependence, uncomplicated: Secondary | ICD-10-CM | POA: Insufficient documentation

## 2021-03-11 DIAGNOSIS — R000 Tachycardia, unspecified: Secondary | ICD-10-CM | POA: Insufficient documentation

## 2021-03-11 DIAGNOSIS — F332 Major depressive disorder, recurrent severe without psychotic features: Secondary | ICD-10-CM | POA: Insufficient documentation

## 2021-03-11 DIAGNOSIS — F606 Avoidant personality disorder: Secondary | ICD-10-CM | POA: Insufficient documentation

## 2021-03-11 DIAGNOSIS — F1410 Cocaine abuse, uncomplicated: Secondary | ICD-10-CM

## 2021-03-11 DIAGNOSIS — Z560 Unemployment, unspecified: Secondary | ICD-10-CM | POA: Insufficient documentation

## 2021-03-11 DIAGNOSIS — F10229 Alcohol dependence with intoxication, unspecified: Secondary | ICD-10-CM

## 2021-03-11 DIAGNOSIS — Z20822 Contact with and (suspected) exposure to covid-19: Secondary | ICD-10-CM | POA: Insufficient documentation

## 2021-03-11 DIAGNOSIS — R45851 Suicidal ideations: Secondary | ICD-10-CM

## 2021-03-11 LAB — CBC WITH DIFFERENTIAL/PLATELET
Abs Immature Granulocytes: 0.05 10*3/uL (ref 0.00–0.07)
Basophils Absolute: 0 10*3/uL (ref 0.0–0.1)
Basophils Relative: 0 %
Eosinophils Absolute: 0 10*3/uL (ref 0.0–0.5)
Eosinophils Relative: 0 %
HCT: 53.9 % — ABNORMAL HIGH (ref 39.0–52.0)
Hemoglobin: 19.2 g/dL — ABNORMAL HIGH (ref 13.0–17.0)
Immature Granulocytes: 0 %
Lymphocytes Relative: 25 %
Lymphs Abs: 3.3 10*3/uL (ref 0.7–4.0)
MCH: 31.6 pg (ref 26.0–34.0)
MCHC: 35.6 g/dL (ref 30.0–36.0)
MCV: 88.8 fL (ref 80.0–100.0)
Monocytes Absolute: 1.3 10*3/uL — ABNORMAL HIGH (ref 0.1–1.0)
Monocytes Relative: 10 %
Neutro Abs: 8.4 10*3/uL — ABNORMAL HIGH (ref 1.7–7.7)
Neutrophils Relative %: 65 %
Platelets: 402 10*3/uL — ABNORMAL HIGH (ref 150–400)
RBC: 6.07 MIL/uL — ABNORMAL HIGH (ref 4.22–5.81)
RDW: 12.8 % (ref 11.5–15.5)
WBC: 13.1 10*3/uL — ABNORMAL HIGH (ref 4.0–10.5)
nRBC: 0 % (ref 0.0–0.2)

## 2021-03-11 LAB — COMPREHENSIVE METABOLIC PANEL
ALT: 39 U/L (ref 0–44)
AST: 32 U/L (ref 15–41)
Albumin: 5.1 g/dL — ABNORMAL HIGH (ref 3.5–5.0)
Alkaline Phosphatase: 83 U/L (ref 38–126)
Anion gap: 17 — ABNORMAL HIGH (ref 5–15)
BUN: 15 mg/dL (ref 6–20)
CO2: 18 mmol/L — ABNORMAL LOW (ref 22–32)
Calcium: 9.4 mg/dL (ref 8.9–10.3)
Chloride: 103 mmol/L (ref 98–111)
Creatinine, Ser: 1.07 mg/dL (ref 0.61–1.24)
GFR, Estimated: >60 mL/min (ref >60)
Glucose, Bld: 117 mg/dL — ABNORMAL HIGH (ref 70–99)
Potassium: 4.4 mmol/L (ref 3.5–5.1)
Sodium: 138 mmol/L (ref 135–145)
Total Bilirubin: 1.2 mg/dL (ref 0.3–1.2)
Total Protein: 8.6 g/dL — ABNORMAL HIGH (ref 6.5–8.1)

## 2021-03-11 LAB — ETHANOL: Alcohol, Ethyl (B): 174 mg/dL — ABNORMAL HIGH (ref <10)

## 2021-03-11 MED ORDER — BACITRACIN ZINC 500 UNIT/GM EX OINT
TOPICAL_OINTMENT | CUTANEOUS | Status: AC
  Administered 2021-03-11: 1
  Filled 2021-03-11: qty 0.9

## 2021-03-11 MED ORDER — LORAZEPAM 1 MG PO TABS
2.0000 mg | ORAL_TABLET | Freq: Once | ORAL | Status: AC
  Administered 2021-03-11: 2 mg via ORAL
  Filled 2021-03-11: qty 2

## 2021-03-11 MED ORDER — NICOTINE 21 MG/24HR TD PT24
21.0000 mg | MEDICATED_PATCH | Freq: Once | TRANSDERMAL | Status: DC
Start: 2021-03-11 — End: 2021-03-12
  Administered 2021-03-11: 21 mg via TRANSDERMAL
  Filled 2021-03-11: qty 1

## 2021-03-11 MED ORDER — LACTATED RINGERS IV BOLUS
1000.0000 mL | Freq: Once | INTRAVENOUS | Status: AC
  Administered 2021-03-11: 1000 mL via INTRAVENOUS

## 2021-03-11 MED ORDER — LACTATED RINGERS IV BOLUS
1000.0000 mL | Freq: Once | INTRAVENOUS | Status: AC
  Administered 2021-03-12: 1000 mL via INTRAVENOUS

## 2021-03-11 NOTE — ED Provider Notes (Addendum)
[facility]-EMERGENCY DEPT Provider Note   CSN: [id number] Arrival date & time: 03/11/21  2157     History Chief Complaint  Patient presents with   Medical Clearance    Brandon [name] is a 45 y.o. male.  Patient is a 45 year old male with a history of hypothyroidism, alcohol abuse, polysubstance abuse, depression and anxiety who is presenting today from behavioral health for medical clearance.  Patient went to behavioral health today because he was feeling hopeless, suicidal and that his life had no meaning.  He had recently spent 6 days at a detox facility in [place] and left there 4 days ago and started drinking around the clock as well as using cocaine.  He does not think he used any meth as far as he knows.  He last had something to drink about 3 hours ago and last used cocaine this morning.  He is feeling very anxious has not had any sleep and feels like his heart is pounding.  He denies any chest pain or shortness of breath.  No abdominal pain or nausea.  No prior history of heart disease.  No medication allergies.  The history is provided by the patient and medical records.      Past Medical History:  Diagnosis Date   Anxiety    Depression    Hyperlipidemia    Substance abuse (HCC)    Thyroid disease     Patient Active Problem List   Diagnosis Date Noted   Alcohol dependence with withdrawal with complication [facility])    MDD (major depressive disorder), recurrent severe, without psychosis (HCC) 11/26/2017   PTSD (post-traumatic stress disorder) 10/15/2017   Bipolar I disorder, current or most recent episode depressed, with psychotic features (HCC) 10/14/2017    History reviewed. No pertinent surgical history.     Family History  Problem Relation Age of Onset   Diabetes Father     Social History   Tobacco Use   Smoking status: Every Day    Types: Cigarettes   Smokeless tobacco: Never  Substance Use Topics   Alcohol use: Yes   Drug use:  Yes    Types: "Crack" cocaine    Home Medications Prior to Admission medications   Medication Sig Start Date End Date Taking? Authorizing Provider  ARIPiprazole (ABILIFY) 10 MG tablet Take 1 tablet (10 mg total) by mouth at bedtime. For mood control 11/30/17   Money, Gerlene Burdock, FNP  busPIRone (BUSPAR) 10 MG tablet Take 1 tablet (10 mg total) by mouth 2 (two) times daily. For mood control 11/30/17   Money, Gerlene Burdock, FNP  DULoxetine (CYMBALTA) 60 MG capsule Take 1 capsule (60 mg total) by mouth daily. For mood control 11/30/17   Money, Gerlene Burdock, FNP  hydrOXYzine (ATARAX/VISTARIL) 25 MG tablet Take 1 tablet (25 mg total) by mouth 3 (three) times daily as needed for anxiety. 11/30/17   Money, Gerlene Burdock, FNP  levothyroxine (SYNTHROID, LEVOTHROID) 88 MCG tablet Take 1 tablet (88 mcg total) by mouth daily before breakfast. For hypothyroidism 12/01/17   Money, Gerlene Burdock, FNP  prazosin (MINIPRESS) 5 MG capsule Take 1 capsule (5 mg total) by mouth at bedtime. For nightmares 11/30/17   Money, Gerlene Burdock, FNP  traZODone (DESYREL) 300 MG tablet Take 1 tablet (300 mg total) by mouth at bedtime. For sleep 11/30/17   Money, Gerlene Burdock, FNP    Allergies    Haldol [haloperidol]  Review of Systems   Review of Systems  All other systems reviewed and  are negative.  Physical Exam Updated Vital Signs BP 124/88   Pulse (!) 133   Temp 98.8 F (37.1 C) (Oral)   Resp 18   Ht 6\' 4"  (1.93 m)   Wt 104.3 kg   SpO2 97%   BMI 28.00 kg/m   Physical Exam Vitals and nursing note reviewed.  Constitutional:      General: He is not in acute distress.    Appearance: He is well-developed.  HENT:     Head: Normocephalic and atraumatic.  Eyes:     Conjunctiva/sclera: Conjunctivae normal.     Pupils: Pupils are equal, round, and reactive to light.  Cardiovascular:     Rate and Rhythm: Regular rhythm. Tachycardia present.     Pulses: Normal pulses.     Heart sounds: No murmur heard. Pulmonary:     Effort: Pulmonary effort  is normal. No respiratory distress.     Breath sounds: Normal breath sounds. No wheezing or rales.  Abdominal:     General: There is no distension.     Palpations: Abdomen is soft.     Tenderness: There is no abdominal tenderness. There is no guarding or rebound.  Musculoskeletal:        General: No tenderness. Normal range of motion.     Cervical back: Normal range of motion and neck supple.     Right lower leg: No edema.     Left lower leg: No edema.     Comments: Superficial abrasions present to bilateral wrist with signs of healing.  Minimal erythema but no evidence of frank infection  Skin:    General: Skin is warm and dry.     Findings: No erythema or rash.  Neurological:     Mental Status: He is alert and oriented to person, place, and time. Mental status is at baseline.     Comments: No signs of tremors or tongue fasciculations at this time  Psychiatric:        Mood and Affect: Mood is depressed.        Behavior: Behavior normal. Behavior is cooperative.        Thought Content: Thought content includes suicidal ideation.     Comments: Slightly anxious appearing but cooperative.  Suicidal thoughts.    ED Results / Procedures / Treatments   Labs (all labs ordered are listed, but only abnormal results are displayed) Labs Reviewed  CBC WITH DIFFERENTIAL/PLATELET - Abnormal; Notable for the following components:      Result Value   WBC 13.1 (*)    RBC 6.07 (*)    Hemoglobin 19.2 (*)    HCT 53.9 (*)    Platelets 402 (*)    Neutro Abs 8.4 (*)    Monocytes Absolute 1.3 (*)    All other components within normal limits  COMPREHENSIVE METABOLIC PANEL - Abnormal; Notable for the following components:   CO2 18 (*)    Glucose, Bld 117 (*)    Total Protein 8.6 (*)    Albumin 5.1 (*)    Anion gap 17 (*)    All other components within normal limits  ETHANOL - Abnormal; Notable for the following components:   Alcohol, Ethyl (B) 174 (*)    All other components within normal  limits  SARS CORONAVIRUS 2 (TAT 6-24 HRS)  RAPID URINE DRUG SCREEN, HOSP PERFORMED    EKG EKG Interpretation  Date/Time:  Sunday March 11 2021 22:52:23 EDT Ventricular Rate:  125 PR Interval:  141 QRS Duration: 99 QT Interval:  309 QTC Calculation: 446 R Axis:   117 Text Interpretation: Sinus tachycardia Right axis deviation 12 Lead; Mason-Likar No significant change since last tracing Confirmed by Gwyneth Sprout ([id number]) on 03/11/2021 11:35:39 PM  Radiology No results found.  Procedures Procedures   Medications Ordered in ED Medications  LORazepam (ATIVAN) tablet 2 mg (2 mg Oral Given 03/11/21 2237)  bacitracin 500 UNIT/GM ointment (1 application  Given 03/11/21 2237)    ED Course  I have reviewed the triage vital signs and the nursing notes.  Pertinent labs & imaging results that were available during my care of the patient were reviewed by me and considered in my medical decision making (see chart for details).    MDM Rules/Calculators/A&P                           Patient is a 45 year old male who is presenting today from behavioral health for medical clearance.  Patient has a history of polysubstance use and alcohol use and has been binge drinking for the last 4 days but also using cocaine.  Patient has been suicidal and has cut his wrist but no evidence of infection at this time.  Wounds were dressed and bacitracin placed.  Patient is currently tachycardic and reports that he feels anxious.  Low suspicion for alcohol withdrawal as he drank 3 hours ago but concern for possible reaction to having cocaine.  We will give 1 L IV bolus to help with tachycardia.  EKG pending.  Patient denies any chest pain or shortness of breath at this time.  11:34 PM Labs with a CBC with a hemoglobin of 12 and assume hemoconcentration and a white count of 13, normal platelet count, CMP with anion gap of 17 normal renal function and alcohol of 174.  KG was sinus tachycardia.  Will give 2 L of  fluid and oral Ativan.  Will need repeat evaluation after this therapy.   Final Clinical Impression(s) / ED Diagnoses Final diagnoses:  None    Rx / DC Orders ED Discharge Orders     None        Gwyneth Sprout, MD 03/11/21 0102    Gwyneth Sprout, MD 03/11/21 [phone]
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
## 2021-03-11 NOTE — ED Triage Notes (Signed)
Pt states he would like to get clean from ETOH and Cocaine. Pt states he usually drinks "Around the Clock." Last drink was 3 hours ago. Pt states he last used Cocaine this morning. Pt states he went to [facility] but was sent over here to get medically cleared. Pt has a laceration to the right wrist.
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
## 2021-03-12 ENCOUNTER — Inpatient Hospital Stay (HOSPITAL_COMMUNITY)
Admission: AD | Admit: 2021-03-12 | Discharge: 2021-03-16 | DRG: 885 | Disposition: A | Discharge status: Home / Self Care | Payer: BC Managed Care – PPO | Source: Intra-hospital | Attending: Emergency Medicine | Admitting: Emergency Medicine

## 2021-03-12 ENCOUNTER — Encounter (HOSPITAL_COMMUNITY): Payer: Self-pay | Admitting: Nurse Practitioner

## 2021-03-12 DIAGNOSIS — F1490 Cocaine use, unspecified, uncomplicated: Secondary | ICD-10-CM | POA: Diagnosis present

## 2021-03-12 DIAGNOSIS — E039 Hypothyroidism, unspecified: Secondary | ICD-10-CM | POA: Diagnosis present

## 2021-03-12 DIAGNOSIS — S51811A Laceration without foreign body of right forearm, initial encounter: Secondary | ICD-10-CM | POA: Diagnosis present

## 2021-03-12 DIAGNOSIS — Z9151 Personal history of suicidal behavior: Secondary | ICD-10-CM | POA: Diagnosis not present

## 2021-03-12 DIAGNOSIS — X781XXA Intentional self-harm by knife, initial encounter: Secondary | ICD-10-CM | POA: Diagnosis present

## 2021-03-12 DIAGNOSIS — Z818 Family history of other mental and behavioral disorders: Secondary | ICD-10-CM | POA: Diagnosis not present

## 2021-03-12 DIAGNOSIS — Z79890 Hormone replacement therapy (postmenopausal): Secondary | ICD-10-CM | POA: Diagnosis not present

## 2021-03-12 DIAGNOSIS — Y906 Blood alcohol level of 120-199 mg/100 ml: Secondary | ICD-10-CM | POA: Diagnosis present

## 2021-03-12 DIAGNOSIS — Z833 Family history of diabetes mellitus: Secondary | ICD-10-CM

## 2021-03-12 DIAGNOSIS — Z9119 Patient's noncompliance with other medical treatment and regimen: Secondary | ICD-10-CM

## 2021-03-12 DIAGNOSIS — R000 Tachycardia, unspecified: Secondary | ICD-10-CM | POA: Diagnosis present

## 2021-03-12 DIAGNOSIS — S51812A Laceration without foreign body of left forearm, initial encounter: Secondary | ICD-10-CM | POA: Diagnosis present

## 2021-03-12 DIAGNOSIS — F332 Major depressive disorder, recurrent severe without psychotic features: Secondary | ICD-10-CM | POA: Diagnosis present

## 2021-03-12 DIAGNOSIS — F10239 Alcohol dependence with withdrawal, unspecified: Secondary | ICD-10-CM | POA: Diagnosis present

## 2021-03-12 DIAGNOSIS — F17210 Nicotine dependence, cigarettes, uncomplicated: Secondary | ICD-10-CM | POA: Diagnosis present

## 2021-03-12 DIAGNOSIS — Z20822 Contact with and (suspected) exposure to covid-19: Secondary | ICD-10-CM | POA: Diagnosis present

## 2021-03-12 LAB — RAPID URINE DRUG SCREEN, HOSP PERFORMED
Amphetamines: NOT DETECTED
Barbiturates: NOT DETECTED
Benzodiazepines: NOT DETECTED
Cocaine: POSITIVE — AB
Opiates: NOT DETECTED
Tetrahydrocannabinol: NOT DETECTED

## 2021-03-12 LAB — RESP PANEL BY RT-PCR (FLU A&B, COVID) ARPGX2
Influenza A by PCR: NEGATIVE
Influenza B by PCR: NEGATIVE
SARS Coronavirus 2 by RT PCR: NEGATIVE

## 2021-03-12 LAB — SARS CORONAVIRUS 2 (TAT 6-24 HRS): SARS Coronavirus 2: NEGATIVE

## 2021-03-12 MED ORDER — THIAMINE HCL 100 MG PO TABS
100.0000 mg | ORAL_TABLET | Freq: Every day | ORAL | Status: DC
  Administered 2021-03-13 – 2021-03-16 (×4): 100 mg via ORAL
  Filled 2021-03-12 (×6): qty 1

## 2021-03-12 MED ORDER — THIAMINE HCL 100 MG/ML IJ SOLN
100.0000 mg | Freq: Once | INTRAMUSCULAR | Status: AC
  Administered 2021-03-12: 100 mg via INTRAMUSCULAR
  Filled 2021-03-12: qty 2

## 2021-03-12 MED ORDER — HYDROXYZINE HCL 50 MG PO TABS
50.0000 mg | ORAL_TABLET | Freq: Three times a day (TID) | ORAL | Status: DC | PRN
  Administered 2021-03-12 – 2021-03-13 (×3): 50 mg via ORAL
  Filled 2021-03-12 (×3): qty 1

## 2021-03-12 MED ORDER — LORAZEPAM 1 MG PO TABS
0.0000 mg | ORAL_TABLET | Freq: Four times a day (QID) | ORAL | Status: DC
  Administered 2021-03-12: 1 mg via ORAL
  Filled 2021-03-12: qty 1

## 2021-03-12 MED ORDER — TRAZODONE HCL 50 MG PO TABS
50.0000 mg | ORAL_TABLET | Freq: Every evening | ORAL | Status: DC | PRN
  Administered 2021-03-12 – 2021-03-13 (×3): 50 mg via ORAL
  Filled 2021-03-12 (×3): qty 1

## 2021-03-12 MED ORDER — HYDROXYZINE PAMOATE 50 MG PO CAPS
50.0000 mg | ORAL_CAPSULE | Freq: Three times a day (TID) | ORAL | Status: DC | PRN
  Filled 2021-03-12 (×2): qty 1

## 2021-03-12 MED ORDER — PANTOPRAZOLE SODIUM 40 MG PO TBEC
40.0000 mg | DELAYED_RELEASE_TABLET | Freq: Every day | ORAL | Status: DC
  Administered 2021-03-12 – 2021-03-16 (×5): 40 mg via ORAL
  Filled 2021-03-12 (×7): qty 1

## 2021-03-12 MED ORDER — HYDROXYZINE HCL 25 MG PO TABS
25.0000 mg | ORAL_TABLET | Freq: Four times a day (QID) | ORAL | Status: AC | PRN
  Administered 2021-03-12 – 2021-03-14 (×3): 25 mg via ORAL
  Filled 2021-03-12 (×3): qty 1

## 2021-03-12 MED ORDER — ACETAMINOPHEN 325 MG PO TABS
650.0000 mg | ORAL_TABLET | Freq: Four times a day (QID) | ORAL | Status: DC | PRN
  Administered 2021-03-12 – 2021-03-14 (×4): 650 mg via ORAL
  Filled 2021-03-12 (×4): qty 2

## 2021-03-12 MED ORDER — TRAZODONE HCL 50 MG PO TABS: 50.0000 mg | ORAL_TABLET | Freq: Every evening | ORAL | Status: DC | PRN

## 2021-03-12 MED ORDER — NICOTINE 21 MG/24HR TD PT24
21.0000 mg | MEDICATED_PATCH | Freq: Every day | TRANSDERMAL | Status: DC
  Administered 2021-03-12 – 2021-03-16 (×5): 21 mg via TRANSDERMAL
  Filled 2021-03-12 (×7): qty 1

## 2021-03-12 MED ORDER — LORAZEPAM 1 MG PO TABS: 1.0000 mg | ORAL_TABLET | Freq: Four times a day (QID) | ORAL | Status: DC

## 2021-03-12 MED ORDER — CHLORDIAZEPOXIDE HCL 25 MG PO CAPS
25.0000 mg | ORAL_CAPSULE | Freq: Four times a day (QID) | ORAL | Status: DC
  Administered 2021-03-12 – 2021-03-13 (×3): 25 mg via ORAL
  Filled 2021-03-12 (×3): qty 1

## 2021-03-12 MED ORDER — CHLORDIAZEPOXIDE HCL 25 MG PO CAPS: 25.0000 mg | ORAL_CAPSULE | Freq: Every day | ORAL | Status: DC

## 2021-03-12 MED ORDER — ADULT MULTIVITAMIN W/MINERALS CH: 1.0000 | ORAL_TABLET | Freq: Every day | ORAL | Status: DC

## 2021-03-12 MED ORDER — LOPERAMIDE HCL 2 MG PO CAPS: 2.0000 mg | ORAL_CAPSULE | ORAL | Status: DC | PRN

## 2021-03-12 MED ORDER — ALUM & MAG HYDROXIDE-SIMETH 200-200-20 MG/5ML PO SUSP
30.0000 mL | ORAL | Status: DC | PRN
  Administered 2021-03-15 – 2021-03-16 (×2): 30 mL via ORAL
  Filled 2021-03-12 (×2): qty 30

## 2021-03-12 MED ORDER — LORAZEPAM 2 MG/ML IJ SOLN: 0.0000 mg | Freq: Four times a day (QID) | INTRAMUSCULAR | Status: DC

## 2021-03-12 MED ORDER — CHLORDIAZEPOXIDE HCL 25 MG PO CAPS: 25.0000 mg | ORAL_CAPSULE | ORAL | Status: DC

## 2021-03-12 MED ORDER — HYDROXYZINE HCL 25 MG PO TABS
25.0000 mg | ORAL_TABLET | Freq: Four times a day (QID) | ORAL | Status: DC | PRN
Start: 2021-03-12 — End: 2021-03-12

## 2021-03-12 MED ORDER — ADULT MULTIVITAMIN W/MINERALS CH
1.0000 | ORAL_TABLET | Freq: Every day | ORAL | Status: DC
  Administered 2021-03-12 – 2021-03-16 (×5): 1 via ORAL
  Filled 2021-03-12 (×7): qty 1

## 2021-03-12 MED ORDER — LORAZEPAM 1 MG PO TABS: 1.0000 mg | ORAL_TABLET | Freq: Every day | ORAL | Status: DC

## 2021-03-12 MED ORDER — CHLORDIAZEPOXIDE HCL 25 MG PO CAPS
25.0000 mg | ORAL_CAPSULE | Freq: Once | ORAL | Status: AC
  Administered 2021-03-12: 25 mg via ORAL
  Filled 2021-03-12: qty 1

## 2021-03-12 MED ORDER — LORAZEPAM 1 MG PO TABS: 1.0000 mg | ORAL_TABLET | Freq: Four times a day (QID) | ORAL | Status: DC | PRN

## 2021-03-12 MED ORDER — THIAMINE HCL 100 MG/ML IJ SOLN: 100.0000 mg | Freq: Every day | INTRAMUSCULAR | Status: DC

## 2021-03-12 MED ORDER — ONDANSETRON 4 MG PO TBDP: 4.0000 mg | ORAL_TABLET | Freq: Four times a day (QID) | ORAL | Status: DC | PRN

## 2021-03-12 MED ORDER — LORAZEPAM 1 MG PO TABS: 1.0000 mg | ORAL_TABLET | Freq: Three times a day (TID) | ORAL | Status: DC

## 2021-03-12 MED ORDER — LORAZEPAM 1 MG PO TABS
1.0000 mg | ORAL_TABLET | Freq: Four times a day (QID) | ORAL | Status: DC
  Administered 2021-03-12: 1 mg via ORAL
  Filled 2021-03-12: qty 1

## 2021-03-12 MED ORDER — THIAMINE HCL 100 MG PO TABS: 100.0000 mg | ORAL_TABLET | Freq: Every day | ORAL | Status: DC

## 2021-03-12 MED ORDER — LORAZEPAM 1 MG PO TABS: 1.0000 mg | ORAL_TABLET | Freq: Two times a day (BID) | ORAL | Status: DC

## 2021-03-12 MED ORDER — VORTIOXETINE HBR 20 MG PO TABS
20.0000 mg | ORAL_TABLET | Freq: Every day | ORAL | Status: DC
  Administered 2021-03-12 – 2021-03-16 (×5): 20 mg via ORAL
  Filled 2021-03-12 (×8): qty 1

## 2021-03-12 MED ORDER — MAGNESIUM HYDROXIDE 400 MG/5ML PO SUSP
30.0000 mL | Freq: Every day | ORAL | Status: DC | PRN
  Administered 2021-03-13: 30 mL via ORAL
  Filled 2021-03-12: qty 30

## 2021-03-12 MED ORDER — LEVOTHYROXINE SODIUM 88 MCG PO TABS
88.0000 ug | ORAL_TABLET | Freq: Every day | ORAL | Status: DC
  Administered 2021-03-12 – 2021-03-16 (×5): 88 ug via ORAL
  Filled 2021-03-12 (×7): qty 1

## 2021-03-12 MED ORDER — CHLORDIAZEPOXIDE HCL 25 MG PO CAPS: 25.0000 mg | ORAL_CAPSULE | Freq: Three times a day (TID) | ORAL | Status: DC

## 2021-03-12 MED ORDER — LOPERAMIDE HCL 2 MG PO CAPS
2.0000 mg | ORAL_CAPSULE | ORAL | Status: AC | PRN
  Administered 2021-03-12: 2 mg via ORAL
  Filled 2021-03-12: qty 1

## 2021-03-12 MED ORDER — CHLORDIAZEPOXIDE HCL 25 MG PO CAPS: 25.0000 mg | ORAL_CAPSULE | Freq: Four times a day (QID) | ORAL | Status: AC | PRN

## 2021-03-12 MED ORDER — ONDANSETRON 4 MG PO TBDP
4.0000 mg | ORAL_TABLET | Freq: Four times a day (QID) | ORAL | Status: AC | PRN
  Administered 2021-03-12 – 2021-03-13 (×4): 4 mg via ORAL
  Filled 2021-03-12 (×4): qty 1

## 2021-03-12 MED ORDER — LABETALOL HCL 5 MG/ML IV SOLN
20.0000 mg | Freq: Once | INTRAVENOUS | Status: AC
  Administered 2021-03-12: 20 mg via INTRAVENOUS
  Filled 2021-03-12: qty 4

## 2021-03-12 NOTE — ED Notes (Signed)
Pt escorted to [facility] transport at entrance of ED.

## 2021-03-12 NOTE — ED Notes (Signed)
Dr. Molpus at bedside. 

## 2021-03-12 NOTE — Progress Notes (Signed)
Recreation Therapy Notes  Date:  7.25.22 Time: 0930 Location: [address] Dayroom  Group Topic: Stress Management  Goal Area(s) Addresses:  Patient will identify positive stress management techniques. Patient will identify benefits of using stress management post d/c.  Intervention: Stress Management  Activity : Meditation.  LRT played [name] meditation that focused on being indecisive.  Meditation talked about being okay with the decisions you make even if they turn out to be the wrong decision, there is still room to learn from them.   Education:  Stress Management, Discharge Planning.   Education Outcome: Acknowledges Education  Clinical Observations/Feedback: Pt did not attend group session.    Malaia Buchta, LRT/CTRS         Brandon [name] 03/12/2021 12:01 PM 

## 2021-03-12 NOTE — ED Notes (Signed)
Pt provided with sandwich.

## 2021-03-12 NOTE — Tx Team (Signed)
Interdisciplinary Treatment and Diagnostic Plan Update  03/12/2021 Time of Session: 1:15pm Brandon [name] MRN: [id number]  Principal Diagnosis: <principal problem not specified>  Secondary Diagnoses: Active Problems:   MDD (major depressive disorder), recurrent severe, without psychosis ([place])   Current Medications:  Current Facility-Administered Medications  Medication Dose Route Frequency Provider Last Rate Last Admin   acetaminophen (TYLENOL) tablet 650 mg  650 mg Oral Q6H PRN Bobbitt, Shalon E, NP       alum & mag hydroxide-simeth (MAALOX/MYLANTA) 200-200-20 MG/5ML suspension 30 mL  30 mL Oral Q4H PRN Bobbitt, Shalon E, NP       hydrOXYzine (VISTARIL) capsule 50 mg  50 mg Oral Q8H PRN Bobbitt, Shalon E, NP       levothyroxine (SYNTHROID) tablet 88 mcg  88 mcg Oral QAC breakfast Bobbitt, Shalon E, NP   88 mcg at 03/12/21 0830   loperamide (IMODIUM) capsule 2-4 mg  2-4 mg Oral PRN Bobbitt, Shalon E, NP       LORazepam (ATIVAN) tablet 1 mg  1 mg Oral Q6H PRN Bobbitt, Shalon E, NP       LORazepam (ATIVAN) tablet 1 mg  1 mg Oral QID Arthor Captain, MD       Followed by   Derrill Memo ON 03/13/2021] LORazepam (ATIVAN) tablet 1 mg  1 mg Oral TID Arthor Captain, MD       Followed by   Derrill Memo ON 03/14/2021] LORazepam (ATIVAN) tablet 1 mg  1 mg Oral BID Arthor Captain, MD       Followed by   Derrill Memo ON 03/15/2021] LORazepam (ATIVAN) tablet 1 mg  1 mg Oral Daily Arthor Captain, MD       magnesium hydroxide (MILK OF MAGNESIA) suspension 30 mL  30 mL Oral Daily PRN Bobbitt, Shalon E, NP       multivitamin with minerals tablet 1 tablet  1 tablet Oral Daily Bobbitt, Shalon E, NP   1 tablet at 03/12/21 0900   ondansetron (ZOFRAN-ODT) disintegrating tablet 4 mg  4 mg Oral Q6H PRN Bobbitt, Shalon E, NP       pantoprazole (PROTONIX) EC tablet 40 mg  40 mg Oral Daily Bobbitt, Shalon E, NP   40 mg at 03/12/21 0900   [START ON 03/13/2021] thiamine tablet 100 mg  100 mg Oral Daily Bobbitt, Shalon E, NP        traZODone (DESYREL) tablet 50 mg  50 mg Oral QHS PRN,MR X 1 Arthor Captain, MD       vortioxetine HBr (TRINTELLIX) tablet 20 mg  20 mg Oral Daily Bobbitt, Shalon E, NP   20 mg at 03/12/21 0900   PTA Medications: Medications Prior to Admission  Medication Sig Dispense Refill Last Dose   hydrOXYzine (VISTARIL) 50 MG capsule Take 50-100 mg by mouth every 8 (eight) hours as needed.      levothyroxine (SYNTHROID, LEVOTHROID) 88 MCG tablet Take 1 tablet (88 mcg total) by mouth daily before breakfast. For hypothyroidism 30 tablet 0    omeprazole (PRILOSEC) 40 MG capsule Take 40 mg by mouth every morning.      TRINTELLIX 20 MG TABS tablet Take 20 mg by mouth daily.       Patient Stressors: Financial difficulties Marital or family conflict Substance abuse  Patient Strengths: Ability for insight General fund of knowledge Motivation for treatment/growth Supportive family/friends  Treatment Modalities: Medication Management, Group therapy, Case management,  1 to 1 session with clinician, Psychoeducation, Recreational therapy.   Physician Treatment  Plan for Primary Diagnosis: <principal problem not specified> Long Term Goal(s):     Short Term Goals:    Medication Management: Evaluate patient's response, side effects, and tolerance of medication regimen.  Therapeutic Interventions: 1 to 1 sessions, Unit Group sessions and Medication administration.  Evaluation of Outcomes: Not Met  Physician Treatment Plan for Secondary Diagnosis: Active Problems:   MDD (major depressive disorder), recurrent severe, without psychosis ([place])  Long Term Goal(s):     Short Term Goals:       Medication Management: Evaluate patient's response, side effects, and tolerance of medication regimen.  Therapeutic Interventions: 1 to 1 sessions, Unit Group sessions and Medication administration.  Evaluation of Outcomes: Not Met   RN Treatment Plan for Primary Diagnosis: <principal problem not specified> Long  Term Goal(s): Knowledge of disease and therapeutic regimen to maintain health will improve  Short Term Goals: Ability to remain free from injury will improve, Ability to verbalize frustration and anger appropriately will improve, Ability to demonstrate self-control, Ability to participate in decision making will improve, Ability to verbalize feelings will improve, Ability to identify and develop effective coping behaviors will improve, and Compliance with prescribed medications will improve  Medication Management: RN will administer medications as ordered by provider, will assess and evaluate patient's response and provide education to patient for prescribed medication. RN will report any adverse and/or side effects to prescribing provider.  Therapeutic Interventions: 1 on 1 counseling sessions, Psychoeducation, Medication administration, Evaluate responses to treatment, Monitor vital signs and CBGs as ordered, Perform/monitor CIWA, COWS, AIMS and Fall Risk screenings as ordered, Perform wound care treatments as ordered.  Evaluation of Outcomes: Not Met   LCSW Treatment Plan for Primary Diagnosis: <principal problem not specified> Long Term Goal(s): Safe transition to appropriate next level of care at discharge, Engage patient in therapeutic group addressing interpersonal concerns.  Short Term Goals: Engage patient in aftercare planning with referrals and resources, Increase social support, Increase ability to appropriately verbalize feelings, Increase emotional regulation, Facilitate patient progression through stages of change regarding substance use diagnoses and concerns, Identify triggers associated with mental health/substance abuse issues, and Increase skills for wellness and recovery  Therapeutic Interventions: Assess for all discharge needs, 1 to 1 time with Social worker, Explore available resources and support systems, Assess for adequacy in community support network, Educate family and  significant other(s) on suicide prevention, Complete Psychosocial Assessment, Interpersonal group therapy.  Evaluation of Outcomes: Not Met   Progress in Treatment: Attending groups: No. Participating in groups: No. Taking medication as prescribed: Yes. Toleration medication: Yes. Family/Significant other contact made: No, will contact:  wife and father Patient understands diagnosis: Yes. Discussing patient identified problems/goals with staff: Yes. Medical problems stabilized or resolved: Yes. Denies suicidal/homicidal ideation: Yes. Issues/concerns per patient self-inventory: No.   New problem(s) identified: No, Describe:  none  New Short Term/Long Term Goal(s): detox, medication management for mood stabilization; elimination of SI thoughts; development of comprehensive mental wellness/sobriety plan  Patient Goals:  "To detox, stabilize and go to treatment"  Discharge Plan or Barriers: Patient recently admitted. CSW will continue to follow and assess for appropriate referrals and possible discharge planning.    Reason for Continuation of Hospitalization: Anxiety Depression Medication stabilization Suicidal ideation Withdrawal symptoms  Estimated Length of Stay: 3-5 days  Attendees: Patient: Brandon [name]  03/12/2021   Physician: Fatima Sanger, DO 03/12/2021   Nursing:  03/12/2021   RN Care Manager: 03/12/2021   Social Worker: Darletta Moll, LCSW 03/12/2021   Recreational Therapist:  03/12/2021  Other:  03/12/2021   Other:  03/12/2021   Other: 03/12/2021     Scribe for Treatment Team: Vassie Moselle, LCSW 03/12/2021 12:48 PM

## 2021-03-12 NOTE — Tx Team (Signed)
Initial Treatment Plan 03/12/2021 8:00 AM Candie Chroman YBW:[id number]    PATIENT STRESSORS: Financial difficulties Marital or family conflict Substance abuse   PATIENT STRENGTHS: Ability for insight General fund of knowledge Motivation for treatment/growth Supportive family/friends   PATIENT IDENTIFIED PROBLEMS: Substance Abuse  Suicidal Ideation  Depression  Anxiety    "Pt wants to detox"  "Pt wants to line up aftercare"         DISCHARGE CRITERIA:  Ability to meet basic life and health needs Adequate post-discharge living arrangements Motivation to continue treatment in a less acute level of care  PRELIMINARY DISCHARGE PLAN: Attend aftercare/continuing care group Outpatient therapy Participate in family therapy  PATIENT/FAMILY INVOLVEMENT: This treatment plan has been presented to and reviewed with the patient, [employer].  The patient and family have been given the opportunity to ask questions and make suggestions.  Marcie Bal, RN 03/12/2021, 8:00 AM

## 2021-03-12 NOTE — Progress Notes (Signed)
     03/12/21 2105  Psych Admission Type (Psych Patients Only)  Admission Status Voluntary  Psychosocial Assessment  Patient Complaints Anxiety;Depression;Suspiciousness;Other (Comment) (diarrhea; nauseated)  Eye Contact Fair  Facial Expression Flat  Affect Anxious;Depressed  Speech Logical/coherent  Interaction Assertive  Motor Activity Tremors (hands)  Appearance/Hygiene Unremarkable  Behavior Characteristics Cooperative;Anxious;Fidgety  Mood Depressed;Anxious;Suspicious;Pleasant  Thought Process  Coherency WDL  Content WDL  Delusions WDL  Perception Hallucinations (pt states, "has AVH post substance use (alcohol & cocaine))  Hallucination None reported or observed  Judgment Poor  Confusion None  Danger to Self  Current suicidal ideation? Denies  Danger to Others  Danger to Others None reported or observed

## 2021-03-12 NOTE — BHH Group Notes (Signed)
Type of Therapy and Topic: Group Therapy: Anger Management   Participation Level:  Did Not Attend  Description of Group: In this group, patients will learn helpful strategies and techniques to manage anger, express anger in alternative ways, change hostile attitudes, and prevent aggressive acts, such as verbal abuse and violence.This group will be process-oriented and eductional, with patients participating in exploration of their own experiences as well as giving and receiving support and challenge from other group members.  Therapeutic Goals: Patient will learn to manage anger. Patient will learn to stop violence or the threat of violence. Patient will learn to develop self control over thoughts and actions. Patient will receive support and feedback from others    Therapeutic Modalities: Cognitive Behavioral Therapy Solution Focused Therapy Motivational Interviewing  Brandon [name], LCSWA Clinicial Social Worker [facility]   

## 2021-03-12 NOTE — Progress Notes (Signed)
D:  Patient denied SI and HI, contracts for safety.  Denied A/V hallucinations.  Denied pain. A:  Medications administered per MD orders.  Emotional support and encouragement given patient. R:  Safety maintained with 15 minute checks.  

## 2021-03-12 NOTE — Progress Notes (Signed)
   Initial assessment Note:  Vasco Chong is a 45 year old male patient who was admitted voluntarily to [facility] with a diagnosis of MDD-Recurrent-Severe; Alcohol Use Disorder-Severe; and Cocaine Use Disorder-Severe.  Pt presents with SI.  Patient has been suicidal and has cut his wrist but no evidence of infection at this time.  Pt has been binge drinking for the last 4 days but also using cocaine.  Pt has a history of hypothyroidism, alcohol abuse, polysubstance abuse, depression and anxiety.  He is alert and oriented to person, place, and time.  Pt mood and affect is depressed and anxious but, cooperative.  Pt skin is warm and dry.  Skin search revealed superficial abrasions on the R&L wrists with signs of healing and slight erythema.  Pt complains of feeling anxiety, decreased concentration, confusion, crying spells, depression, hopelessness, insomnia, nervousness, restlessness, sadness, worrying, shakiness, and suspiciousness. He recently spent 6 days at a detox facility in [place] and left there 4 days ago and started drinking around the clock as well as using cocaine.    Pt presently denies SI/HI and verbally contracts for safety.  Pt denies any AVH at this time.  Pt denies any specific triggers.  Admission plan of care reviewed.  Personal belongings sheet completed and locker issued.  Routine safety checks initiated.  Pt oriented to the unit and staff and room.  Pt verbalizes understanding of unit rules/protocols.  Pt is safe on unit at this time.

## 2021-03-12 NOTE — Plan of Care (Signed)
  Problem: Education: Goal: Emotional status will improve Outcome: Not Progressing Goal: Mental status will improve Outcome: Not Progressing   Problem: Coping: Goal: Coping ability will improve Outcome: Not Progressing   
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
## 2021-03-12 NOTE — ED Provider Notes (Addendum)
Nursing notes and vitals signs, including pulse oximetry, reviewed.  Summary of this visit's results, reviewed by myself:  EKG:  EKG Interpretation  Date/Time:  Sunday March 11 2021 22:52:23 EDT Ventricular Rate:  125 PR Interval:  141 QRS Duration: 99 QT Interval:  309 QTC Calculation: 446 R Axis:   117 Text Interpretation: Sinus tachycardia Right axis deviation 12 Lead; [place] No significant change since last tracing Confirmed by Gwyneth Sprout ([id number]) on 03/11/2021 11:35:39 PM        Labs:  Results for orders placed or performed during the hospital encounter of 03/11/21 (from the past 24 hour(s))  CBC with Differential/Platelet     Status: Abnormal   Collection Time: 03/11/21 10:52 PM  Result Value Ref Range   WBC 13.1 (H) 4.0 - 10.5 K/uL   RBC 6.07 (H) 4.22 - 5.81 MIL/uL   Hemoglobin 19.2 (H) 13.0 - 17.0 g/dL   HCT 07.1 (H) 21.9 - 75.8 %   MCV 88.8 80.0 - 100.0 fL   MCH 31.6 26.0 - 34.0 pg   MCHC 35.6 30.0 - 36.0 g/dL   RDW 83.2 54.9 - 82.6 %   Platelets 402 (H) 150 - 400 K/uL   nRBC 0.0 0.0 - 0.2 %   Neutrophils Relative % 65 %   Neutro Abs 8.4 (H) 1.7 - 7.7 K/uL   Lymphocytes Relative 25 %   Lymphs Abs 3.3 0.7 - 4.0 K/uL   Monocytes Relative 10 %   Monocytes Absolute 1.3 (H) 0.1 - 1.0 K/uL   Eosinophils Relative 0 %   Eosinophils Absolute 0.0 0.0 - 0.5 K/uL   Basophils Relative 0 %   Basophils Absolute 0.0 0.0 - 0.1 K/uL   Immature Granulocytes 0 %   Abs Immature Granulocytes 0.05 0.00 - 0.07 K/uL  Comprehensive metabolic panel     Status: Abnormal   Collection Time: 03/11/21 10:52 PM  Result Value Ref Range   Sodium 138 135 - 145 mmol/L   Potassium 4.4 3.5 - 5.1 mmol/L   Chloride 103 98 - 111 mmol/L   CO2 18 (L) 22 - 32 mmol/L   Glucose, Bld 117 (H) 70 - 99 mg/dL   BUN 15 6 - 20 mg/dL   Creatinine, Ser 4.15 0.61 - 1.24 mg/dL   Calcium 9.4 8.9 - 83.0 mg/dL   Total Protein 8.6 (H) 6.5 - 8.1 g/dL   Albumin 5.1 (H) 3.5 - 5.0 g/dL   AST 32 15 - 41  U/L   ALT 39 0 - 44 U/L   Alkaline Phosphatase 83 38 - 126 U/L   Total Bilirubin 1.2 0.3 - 1.2 mg/dL   GFR, Estimated >94 >07 mL/min   Anion gap 17 (H) 5 - 15  Ethanol     Status: Abnormal   Collection Time: 03/11/21 10:52 PM  Result Value Ref Range   Alcohol, Ethyl (B) 174 (H) <10 mg/dL  Rapid urine drug screen (hospital performed)     Status: Abnormal   Collection Time: 03/11/21 10:52 PM  Result Value Ref Range   Opiates NONE DETECTED NONE DETECTED   Cocaine POSITIVE (A) NONE DETECTED   Benzodiazepines NONE DETECTED NONE DETECTED   Amphetamines NONE DETECTED NONE DETECTED   Tetrahydrocannabinol NONE DETECTED NONE DETECTED   Barbiturates NONE DETECTED NONE DETECTED   1:00 AM Second bag of IV fluids still infusing.  Patient states he feels much better.  He is not tremulous and is calmly eating a sandwich.  He continues to be tachycardic at 125  but states he frequently gets tachycardic after using cocaine.  He is no longer feeling palpitations.  His blood pressure is mildly elevated at this time.  We will give him a dose of labetalol and reevaluate.  2:27 AM Tachycardia improved after IV labetalol.  Patient sleeping comfortably.  I believe he is cleared for behavioral health assessment.    Evann Erazo, Jonny Ruiz, MD 03/12/21 2841    Paula Libra, MD 03/12/21 [id number]
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
## 2021-03-12 NOTE — H&P (Signed)
Psychiatric Admission Assessment Adult  Patient Identification: Brandon [name] MRN:  [id number] Date of Evaluation:  03/12/2021 Chief Complaint:  MDD (major depressive disorder), recurrent severe, without psychosis ([place]) [F33.2] Principal Diagnosis: MDD (major depressive disorder), recurrent severe, without psychosis ([place]) Diagnosis:  Principal Problem:   MDD (major depressive disorder), recurrent severe, without psychosis ([place]) Active Problems:   Alcohol dependence with withdrawal with complication ([place])  History of Present Illness: Medical record reviewed.  Patient's case discussed in detail with members of the treatment team.  I met with and evaluated the patient on the unit today.  Brandon [name] is a 45 year old male with a history of alcohol dependence, alcohol withdrawal, substance use disorder (cocaine, cannabis, remote methamphetamine, remote history of IVDU), PTSD, MDD versus bipolar disorder who presented on 03/11/2021 to [facility] with his father with suicidal ideation and reporting recent cutting of his forearms bilaterally with a knife several days prior in the context of recent relapse on alcohol and cocaine.  Most recent drink was approximately 7 PM on 03/11/2021.  Patient was sent to [facility] to be assessed for medical stability prior to admission to [facility].  In the ED BAL was 174 and UDS was positive for cocaine.  Patient was hydrated with several liters of fluid, started on oral lorazepam to prevent alcohol withdrawal and given IV labetalol.  He was transferred to [facility] for detox from alcohol and treatment of depression and suicidal ideation.  On evaluation today, patient is cooperative and pleasant but appears tremulous, anxious, depressed and shaky with constricted affect.  He reports recently being discharged from a detox facility 5 days ago and has been consuming alcohol daily since that time and using cocaine for the past 3 to 4 days.  Patient reports use of alcohol "around-the-clock" and  estimates consuming 14-24 beers or 1/5 of vodka per day for the last 5 days.  Patient estimates use of approximately $300 per day of cocaine over the last 3 to 4 days.  He used meth on 1 occasion in recent days.  He denies any other recent drug use other than infrequent marijuana use.  Patient reports a history of intermittent hospitalizations over the last 20 years related to substance use and states that he has been having 12-monthcycles of becoming sober and then relapsing in recent years.  He also reports a history of depression since age [age]which comes and goes.  Patient states that he had been doing well 9 or 10 months ago and is newly married.  He is now worried that because of his relapse he would lose his marriage.  He reports symptoms of depressed mood, difficulty sleeping, decreased appetite, suicidal ideation.  He states that he cut his wrists with a knife bilaterally and sat in the bathtub with the goal of bleeding to death.  Patient denies suicidal ideation now.  He denies current AI, HI, AH, VH or PI.  He reports a history of alcohol withdrawal in the past with hallucinations but denies any history of seizures.  He reports current withdrawal symptoms of tremor, feeling shaky, nausea, diarrhea, anxiety, chills and body aches.  Patient is interested in going to residential substance use disorder treatment program after discharge.  Patient reports most recently being treated with a combination of trazodone, Trintellix and levothyroxine (for history of hypothyroidism).  Associated Signs/Symptoms: Depression Symptoms:  depressed mood, anhedonia, insomnia, fatigue, feelings of worthlessness/guilt, difficulty concentrating, hopelessness, suicidal attempt, anxiety, decreased appetite, Suicidal thoughts Duration of Depression Symptoms: Greater than two weeks  (Hypo)  Manic Symptoms:  Distractibility, Impulsivity, Anxiety Symptoms:  Excessive Worry, Psychotic Symptoms:   denies PTSD  Symptoms: Not able to assess due to patient discomfort at this time Total Time spent with patient: 30 minutes  Past Psychiatric History: Patient reports past psychiatric diagnoses of depression and PTSD.  He currently does not have a therapist and his psychiatric medications are prescribed by his PCP Clydie Braun at [facility].  He reports a history of 1 prior suicide attempt a few years ago by overdose on pills after which he vomited and was admitted to the hospital.  He estimates approximately 6-7 prior psychiatric admissions related to drug use and paranoia.  Patient's most recent psychiatric admission to [facility] was from 11/26/2017 until 11/30/2017 for major depression.  He was discharged at that time on a combination of Abilify 10 mg daily, BuSpar 10 mg twice daily, Cymbalta 60 mg daily, trazodone 300 mg at bedtime and prazosin 5 mg at bedtime as well as his Synthroid 88 mcg daily.  Is the patient at risk to self? Yes.    Has the patient been a risk to self in the past 6 months? Yes.    Has the patient been a risk to self within the distant past? Yes.    Is the patient a risk to others? No.  Has the patient been a risk to others in the past 6 months? No.  Has the patient been a risk to others within the distant past? No.   Prior Inpatient Therapy:   Prior Outpatient Therapy:    Alcohol Screening: Patient refused Alcohol Screening Tool: Yes 1. How often do you have a drink containing alcohol?: 4 or more times a week 2. How many drinks containing alcohol do you have on a typical day when you are drinking?: 5 or 6 3. How often do you have six or more drinks on one occasion?: Daily or almost daily AUDIT-C Score: 10 4. How often during the last year have you found that you were not able to stop drinking once you had started?: Weekly 5. How often during the last year have you failed to do what was normally expected from you because of drinking?: Weekly 6. How often during the  last year have you needed a first drink in the morning to get yourself going after a heavy drinking session?: Weekly 7. How often during the last year have you had a feeling of guilt of remorse after drinking?: Weekly 8. How often during the last year have you been unable to remember what happened the night before because you had been drinking?: Weekly 9. Have you or someone else been injured as a result of your drinking?: No 10. Has a relative or friend or a doctor or another health worker been concerned about your drinking or suggested you cut down?: Yes, during the last year Alcohol Use Disorder Identification Test Final Score (AUDIT): 29 Substance Abuse History in the last 12 months:  Yes.   Consequences of Substance Abuse: See HPI above Previous Psychotropic Medications: Yes  Psychological Evaluations: Yes  Past Medical History:  Past Medical History:  Diagnosis Date   Anxiety    Depression    Hyperlipidemia    Substance abuse ([place])    Thyroid disease    History reviewed. No pertinent surgical history. Family History:  Family History  Problem Relation Age of Onset   Diabetes Father    Family Psychiatric  History: Patient states that his family has a history of  depression (father, mother, sister) and anxiety (father).  He denies any family history of completed suicide.  He reports a history of alcohol problems in extended family on his mother side. Tobacco Screening:   Social History:  Social History   Substance and Sexual Activity  Alcohol Use Yes     Social History   Substance and Sexual Activity  Drug Use Yes   Types: "Crack" cocaine    Additional Social History:                           Allergies:   Allergies  Allergen Reactions   Haldol [Haloperidol] Other (See Comments)    Unknown, he sates his "mouth went sideways."   Lab Results:  Results for orders placed or performed during the hospital encounter of 03/11/21 (from the past 48 hour(s))  CBC  with Differential/Platelet     Status: Abnormal   Collection Time: 03/11/21 10:52 PM  Result Value Ref Range   WBC 13.1 (H) 4.0 - 10.5 K/uL   RBC 6.07 (H) 4.22 - 5.81 MIL/uL   Hemoglobin 19.2 (H) 13.0 - 17.0 g/dL   HCT 53.9 (H) 39.0 - 52.0 %   MCV 88.8 80.0 - 100.0 fL   MCH 31.6 26.0 - 34.0 pg   MCHC 35.6 30.0 - 36.0 g/dL   RDW 12.8 11.5 - 15.5 %   Platelets 402 (H) 150 - 400 K/uL   nRBC 0.0 0.0 - 0.2 %   Neutrophils Relative % 65 %   Neutro Abs 8.4 (H) 1.7 - 7.7 K/uL   Lymphocytes Relative 25 %   Lymphs Abs 3.3 0.7 - 4.0 K/uL   Monocytes Relative 10 %   Monocytes Absolute 1.3 (H) 0.1 - 1.0 K/uL   Eosinophils Relative 0 %   Eosinophils Absolute 0.0 0.0 - 0.5 K/uL   Basophils Relative 0 %   Basophils Absolute 0.0 0.0 - 0.1 K/uL   Immature Granulocytes 0 %   Abs Immature Granulocytes 0.05 0.00 - 0.07 K/uL    Comment: Performed at [facility], [place] [address], [place], [place] [address]  Comprehensive metabolic panel     Status: Abnormal   Collection Time: 03/11/21 10:52 PM  Result Value Ref Range   Sodium 138 135 - 145 mmol/L   Potassium 4.4 3.5 - 5.1 mmol/L   Chloride 103 98 - 111 mmol/L   CO2 18 (L) 22 - 32 mmol/L   Glucose, Bld 117 (H) 70 - 99 mg/dL    Comment: Glucose reference range applies only to samples taken after fasting for at least 8 hours.   BUN 15 6 - 20 mg/dL   Creatinine, Ser 1.07 0.61 - 1.24 mg/dL   Calcium 9.4 8.9 - 10.3 mg/dL   Total Protein 8.6 (H) 6.5 - 8.1 g/dL   Albumin 5.1 (H) 3.5 - 5.0 g/dL   AST 32 15 - 41 U/L   ALT 39 0 - 44 U/L   Alkaline Phosphatase 83 38 - 126 U/L   Total Bilirubin 1.2 0.3 - 1.2 mg/dL   GFR, Estimated >60 >60 mL/min    Comment: (NOTE) Calculated using the CKD-EPI Creatinine Equation (2021)    Anion gap 17 (H) 5 - 15    Comment: Performed at [facility], [place] [address], [place], [place] [address]  Ethanol     Status: Abnormal   Collection Time: 03/11/21 10:52 PM  Result Value Ref  Range   Alcohol,  Ethyl (B) 174 (H) <10 mg/dL    Comment: (NOTE) Lowest detectable limit for serum alcohol is 10 mg/dL.  For medical purposes only. Performed at [facility], [place] [address], [place], [place] [address]   Rapid urine drug screen (hospital performed)     Status: Abnormal   Collection Time: 03/11/21 10:52 PM  Result Value Ref Range   Opiates NONE DETECTED NONE DETECTED   Cocaine POSITIVE (A) NONE DETECTED   Benzodiazepines NONE DETECTED NONE DETECTED   Amphetamines NONE DETECTED NONE DETECTED   Tetrahydrocannabinol NONE DETECTED NONE DETECTED   Barbiturates NONE DETECTED NONE DETECTED    Comment: (NOTE) DRUG SCREEN FOR MEDICAL PURPOSES ONLY.  IF CONFIRMATION IS NEEDED FOR ANY PURPOSE, NOTIFY LAB WITHIN 5 DAYS.  LOWEST DETECTABLE LIMITS FOR URINE DRUG SCREEN Drug Class                     Cutoff (ng/mL) Amphetamine and metabolites    1000 Barbiturate and metabolites    200 Benzodiazepine                 235 Tricyclics and metabolites     300 Opiates and metabolites        300 Cocaine and metabolites        300 THC                            50 Performed at [facility], [place] [address] [place], [place] [address]   SARS CORONAVIRUS 2 (TAT 6-24 HRS) Nasopharyngeal Nasopharyngeal Swab     Status: None   Collection Time: 03/11/21 10:52 PM   Specimen: Nasopharyngeal Swab  Result Value Ref Range   SARS Coronavirus 2 NEGATIVE NEGATIVE    Comment: (NOTE) SARS-CoV-2 target nucleic acids are NOT DETECTED.  The SARS-CoV-2 RNA is generally detectable in upper and lower respiratory specimens during the acute phase of infection. Negative results do not preclude SARS-CoV-2 infection, do not rule out co-infections with other pathogens, and should not be used as the sole basis for treatment or other patient management decisions. Negative results must be combined with clinical observations, patient history, and epidemiological  information. The expected result is Negative.  Fact Sheet for Patients: SugarRoll.be  Fact Sheet for Healthcare Providers: https://www.woods-mathews.com/  This test is not yet approved or cleared by the [place] FDA and  has been authorized for detection and/or diagnosis of SARS-CoV-2 by FDA under an Emergency Use Authorization (EUA). This EUA will remain  in effect (meaning this test can be used) for the duration of the COVID-19 declaration under Se ction 564(b)(1) of the Act, 21 U.S.C. section 360bbb-3(b)(1), unless the authorization is terminated or revoked sooner.  Performed at [facility], [place] [address] [place], [place] [address]   Resp Panel by RT-PCR (Flu A&B, Covid) Nasopharyngeal Swab     Status: None   Collection Time: 03/12/21  3:22 AM   Specimen: Nasopharyngeal Swab; Nasopharyngeal(NP) swabs in vial transport medium  Result Value Ref Range   SARS Coronavirus 2 by RT PCR NEGATIVE NEGATIVE    Comment: (NOTE) SARS-CoV-2 target nucleic acids are NOT DETECTED.  The SARS-CoV-2 RNA is generally detectable in upper respiratory specimens during the acute phase of infection. The lowest concentration of SARS-CoV-2 viral copies this assay can detect is 138 copies/mL. A negative result does not preclude SARS-Cov-2 infection and should not be used as the sole basis for treatment  or other patient management decisions. A negative result may occur with  improper specimen collection/handling, submission of specimen other than nasopharyngeal swab, presence of viral mutation(s) within the areas targeted by this assay, and inadequate number of viral copies(<138 copies/mL). A negative result must be combined with clinical observations, patient history, and epidemiological information. The expected result is Negative.  Fact Sheet for Patients:  EntrepreneurPulse.com.au  Fact Sheet for Healthcare Providers:   IncredibleEmployment.be  This test is no t yet approved or cleared by the [place] FDA and  has been authorized for detection and/or diagnosis of SARS-CoV-2 by FDA under an Emergency Use Authorization (EUA). This EUA will remain  in effect (meaning this test can be used) for the duration of the COVID-19 declaration under Section 564(b)(1) of the Act, 21 U.S.C.section 360bbb-3(b)(1), unless the authorization is terminated  or revoked sooner.       Influenza A by PCR NEGATIVE NEGATIVE   Influenza B by PCR NEGATIVE NEGATIVE    Comment: (NOTE) The Xpert Xpress SARS-CoV-2/FLU/RSV plus assay is intended as an aid in the diagnosis of influenza from Nasopharyngeal swab specimens and should not be used as a sole basis for treatment. Nasal washings and aspirates are unacceptable for Xpert Xpress SARS-CoV-2/FLU/RSV testing.  Fact Sheet for Patients: EntrepreneurPulse.com.au  Fact Sheet for Healthcare Providers: IncredibleEmployment.be  This test is not yet approved or cleared by the [place] FDA and has been authorized for detection and/or diagnosis of SARS-CoV-2 by FDA under an Emergency Use Authorization (EUA). This EUA will remain in effect (meaning this test can be used) for the duration of the COVID-19 declaration under Section 564(b)(1) of the Act, 21 U.S.C. section 360bbb-3(b)(1), unless the authorization is terminated or revoked.  Performed at [facility], [place] [address] [place], [place] [address]     Blood Alcohol level:  Lab Results  Component Value Date   ETH 174 (H) 03/11/2021   ETH <10 99/37/1696    Metabolic Disorder Labs:  No results found for: HGBA1C, MPG No results found for: PROLACTIN No results found for: CHOL, TRIG, HDL, CHOLHDL, VLDL, LDLCALC  Current Medications: Current Facility-Administered Medications  Medication Dose Route Frequency Provider Last Rate Last Admin    acetaminophen (TYLENOL) tablet 650 mg  650 mg Oral Q6H PRN Bobbitt, Shalon E, NP   650 mg at 03/12/21 1455   alum & mag hydroxide-simeth (MAALOX/MYLANTA) 200-200-20 MG/5ML suspension 30 mL  30 mL Oral Q4H PRN Bobbitt, Shalon E, NP       chlordiazePOXIDE (LIBRIUM) capsule 25 mg  25 mg Oral Q6H PRN Arthor Captain, MD       chlordiazePOXIDE (LIBRIUM) capsule 25 mg  25 mg Oral QID Arthor Captain, MD       Followed by   Derrill Memo ON 03/13/2021] chlordiazePOXIDE (LIBRIUM) capsule 25 mg  25 mg Oral TID Arthor Captain, MD       Followed by   Derrill Memo ON 03/14/2021] chlordiazePOXIDE (LIBRIUM) capsule 25 mg  25 mg Oral Haroldine Laws, MD       Followed by   Derrill Memo ON 03/16/2021] chlordiazePOXIDE (LIBRIUM) capsule 25 mg  25 mg Oral Daily Arthor Captain, MD       hydrOXYzine (ATARAX/VISTARIL) tablet 25 mg  25 mg Oral Q6H PRN Arthor Captain, MD       hydrOXYzine (ATARAX/VISTARIL) tablet 50 mg  50 mg Oral Q8H PRN Harlow Asa, MD       levothyroxine (SYNTHROID) tablet 88 mcg  88 mcg Oral QAC breakfast Bobbitt, Shalon E, NP   88 mcg at 03/12/21 0830   loperamide (IMODIUM) capsule 2-4 mg  2-4 mg Oral PRN Bobbitt, Shalon E, NP       magnesium hydroxide (MILK OF MAGNESIA) suspension 30 mL  30 mL Oral Daily PRN Bobbitt, Shalon E, NP       multivitamin with minerals tablet 1 tablet  1 tablet Oral Daily Bobbitt, Shalon E, NP   1 tablet at 03/12/21 0900   nicotine (NICODERM CQ - dosed in mg/24 hours) patch 21 mg  21 mg Transdermal Daily Nelda Marseille, Amy E, MD   21 mg at 03/12/21 1500   ondansetron (ZOFRAN-ODT) disintegrating tablet 4 mg  4 mg Oral Q6H PRN Bobbitt, Shalon E, NP   4 mg at 03/12/21 1452   pantoprazole (PROTONIX) EC tablet 40 mg  40 mg Oral Daily Bobbitt, Shalon E, NP   40 mg at 03/12/21 0900   [START ON 03/13/2021] thiamine tablet 100 mg  100 mg Oral Daily Bobbitt, Shalon E, NP       traZODone (DESYREL) tablet 50 mg  50 mg Oral QHS PRN,MR X 1 Arthor Captain, MD       vortioxetine HBr  (TRINTELLIX) tablet 20 mg  20 mg Oral Daily Bobbitt, Shalon E, NP   20 mg at 03/12/21 0900   PTA Medications: Medications Prior to Admission  Medication Sig Dispense Refill Last Dose   hydrOXYzine (VISTARIL) 50 MG capsule Take 50-100 mg by mouth every 8 (eight) hours as needed.      levothyroxine (SYNTHROID, LEVOTHROID) 88 MCG tablet Take 1 tablet (88 mcg total) by mouth daily before breakfast. For hypothyroidism 30 tablet 0    omeprazole (PRILOSEC) 40 MG capsule Take 40 mg by mouth every morning.      TRINTELLIX 20 MG TABS tablet Take 20 mg by mouth daily.       Musculoskeletal: Strength & Muscle Tone: within normal limits Gait & Station: normal Patient leans: N/A            Psychiatric Specialty Exam:  Presentation  General Appearance: Disheveled  Eye Contact:Good  Speech:Normal Rate  Speech Volume:Normal  Handedness: No data recorded  Mood and Affect  Mood:Depressed; Anxious  Affect:Congruent   Thought Process  Thought Processes:Coherent; Goal Directed  Duration of Psychotic Symptoms: No data recorded Past Diagnosis of Schizophrenia or Psychoactive disorder: No  Descriptions of Associations:Intact  Orientation:Full (Time, Place and Person)  Thought Content:Logical  Hallucinations:Hallucinations: None  Ideas of Reference:None  Suicidal Thoughts:Suicidal Thoughts: Yes, Passive SI Active Intent and/or Plan: With Intent; With Plan SI Passive Intent and/or Plan: Without Intent; Without Plan  Homicidal Thoughts:Homicidal Thoughts: No   Sensorium  Memory:Immediate Fair; Recent Fair  Judgment:Fair  Insight:Fair   Executive Functions  Concentration:Fair  Attention Span:Fair  [place]  Language:Good   Psychomotor Activity  Psychomotor Activity:Psychomotor Activity: Tremor   Assets  Assets:Communication Skills; Desire for Improvement; Housing; Social Support; Transportation   Sleep  Sleep:Sleep:  Poor Number of Hours of Sleep: -1    Physical Exam: Physical Exam Vitals and nursing note reviewed. Constitutional:      Appearance: Normal appearance. He is not diaphoretic. HENT:    Head: Normocephalic and atraumatic. Cardiovascular:    Rate and Rhythm: Tachycardia present. Pulmonary:    Effort: Pulmonary effort is normal. Neurological:    General: No focal deficit present.    Mental Status: He is alert and oriented to person, place, and time.  ROS Constitutional:  Positive for chills and diaphoresis. Negative for fever. HENT:  Negative for sore throat.   Eyes: Negative.   Respiratory:  Negative for cough and shortness of breath.   Cardiovascular:  Negative for chest pain and palpitations. Gastrointestinal:  Positive for diarrhea and nausea. Negative for constipation and vomiting. Genitourinary: Negative.   Musculoskeletal: Negative.   Skin:  Negative for rash. Neurological:  Positive for tremors. Negative for dizziness, seizures and headaches. Psychiatric/Behavioral:  Positive for depression, substance abuse and suicidal ideas. Negative for hallucinations. The patient is nervous/anxious and has insomnia.   All other systems reviewed and are negative.  Blood pressure 121/88, pulse (!) 126, temperature 98.1 F (36.7 C), temperature source Oral, resp. rate 18, height 6' 4" (1.93 m), weight 102.1 kg, SpO2 97 %. Body mass index is 27.39 kg/m.  Treatment Plan Summary: Daily contact with patient to assess and evaluate symptoms and progress in treatment and Medication management  Observation Level/Precautions:  Detox 15 minute checks  Laboratory:  CBC Chemistry Profile HbAIC UDS Lipid panel, TFTs Available lab results reviewed.  CMP revealed CO2 of 18, glucose of 117, albumin of 5.1, total protein of 8.6 and otherwise WNL.  CBC and differential revealed WBC of 13.1, RBC of 6.07, hemoglobin of 19.2, hematocrit of 53.9, platelets of 402, ANC of 8400, absolute monocyte count of  1300 and otherwise WNL.  Abnormalities likely due to dehydration.  BAL was 174.  Influenza A, influenza B and coronavirus testing were negative.  Urine drug screen was positive for cocaine.  Lipid panel, TFTs and hemoglobin A1c have been ordered for a.m. draw tomorrow along with repeat CMP and repeat CBC with differential.    EKG revealed sinus tachycardia with right axis deviation, ventricular rate of 125 and QT/QTc of 309/446.  Psychotherapy: Courage participation in group therapy and therapeutic milieu.  Medications:  Patient is on CIWA protocol.  Have discontinued standing dose lorazepam taper and started standing dose chlordiazepoxide taper as well as chlordiazepoxide 25 mg Q6H PRN for CIWA scores greater than 10.  We will continue Trintellix 20 mg daily for depression.  Trazodone 50 mg at bedtime PRN.  Synthroid 88 mcg daily continued for hypothyroidism.  See MAR.  Consultations:    Discharge Concerns:  Patient will need referral to residential substance abuse treatment program at discharge.  He also would benefit from seeing outpatient psychiatrist and therapist after he completes residential substance abuse treatment program.  Estimated LOS: 4 to 6 days  Other:     Physician Treatment Plan for Primary Diagnosis: MDD (major depressive disorder), recurrent severe, without psychosis ([place]) Long Term Goal(s): Improvement in symptoms so as ready for discharge  Short Term Goals: Ability to identify changes in lifestyle to reduce recurrence of condition will improve, Ability to verbalize feelings will improve, Ability to disclose and discuss suicidal ideas, Ability to demonstrate self-control will improve, Ability to identify and develop effective coping behaviors will improve, Ability to maintain clinical measurements within normal limits will improve, Compliance with prescribed medications will improve, and Ability to identify triggers associated with substance abuse/mental health issues will  improve  Physician Treatment Plan for Secondary Diagnosis: Principal Problem:   MDD (major depressive disorder), recurrent severe, without psychosis ([place]) Active Problems:   Alcohol dependence with withdrawal with complication ([place])  Long Term Goal(s): Improvement in symptoms so as ready for discharge  Short Term Goals: Ability to identify changes in lifestyle to reduce recurrence of condition will improve, Ability to verbalize feelings will improve, Ability  to disclose and discuss suicidal ideas, Ability to demonstrate self-control will improve, Ability to identify and develop effective coping behaviors will improve, Ability to maintain clinical measurements within normal limits will improve, Compliance with prescribed medications will improve, and Ability to identify triggers associated with substance abuse/mental health issues will improve  I certify that inpatient services furnished can reasonably be expected to improve the patient's condition.    Arthor Captain, MD 7/25/20224:04 PM

## 2021-03-13 LAB — COMPREHENSIVE METABOLIC PANEL
ALT: 27 U/L (ref 0–44)
AST: 21 U/L (ref 15–41)
Albumin: 4 g/dL (ref 3.5–5.0)
Alkaline Phosphatase: 66 U/L (ref 38–126)
Anion gap: 7 (ref 5–15)
BUN: 17 mg/dL (ref 6–20)
CO2: 27 mmol/L (ref 22–32)
Calcium: 9 mg/dL (ref 8.9–10.3)
Chloride: 105 mmol/L (ref 98–111)
Creatinine, Ser: 1.01 mg/dL (ref 0.61–1.24)
GFR, Estimated: >60 mL/min (ref >60)
Glucose, Bld: 90 mg/dL (ref 70–99)
Potassium: 4.1 mmol/L (ref 3.5–5.1)
Sodium: 139 mmol/L (ref 135–145)
Total Bilirubin: 1.3 mg/dL — ABNORMAL HIGH (ref 0.3–1.2)
Total Protein: 6.7 g/dL (ref 6.5–8.1)

## 2021-03-13 LAB — CBC WITH DIFFERENTIAL/PLATELET
Abs Immature Granulocytes: 0.01 10*3/uL (ref 0.00–0.07)
Basophils Absolute: 0 10*3/uL (ref 0.0–0.1)
Basophils Relative: 0 %
Eosinophils Absolute: 0.1 10*3/uL (ref 0.0–0.5)
Eosinophils Relative: 2 %
HCT: 46.1 % (ref 39.0–52.0)
Hemoglobin: 15.6 g/dL (ref 13.0–17.0)
Immature Granulocytes: 0 %
Lymphocytes Relative: 41 %
Lymphs Abs: 2.8 10*3/uL (ref 0.7–4.0)
MCH: 31.8 pg (ref 26.0–34.0)
MCHC: 33.8 g/dL (ref 30.0–36.0)
MCV: 94.1 fL (ref 80.0–100.0)
Monocytes Absolute: 0.7 10*3/uL (ref 0.1–1.0)
Monocytes Relative: 10 %
Neutro Abs: 3.1 10*3/uL (ref 1.7–7.7)
Neutrophils Relative %: 47 %
Platelets: 279 10*3/uL (ref 150–400)
RBC: 4.9 MIL/uL (ref 4.22–5.81)
RDW: 13.2 % (ref 11.5–15.5)
WBC: 6.8 10*3/uL (ref 4.0–10.5)
nRBC: 0 % (ref 0.0–0.2)

## 2021-03-13 LAB — LIPID PANEL
Cholesterol: 170 mg/dL (ref 0–200)
HDL: 32 mg/dL — ABNORMAL LOW (ref >40)
LDL Cholesterol: 72 mg/dL (ref 0–99)
Total CHOL/HDL Ratio: 5.3 RATIO
Triglycerides: 330 mg/dL — ABNORMAL HIGH (ref <150)
VLDL: 66 mg/dL — ABNORMAL HIGH (ref 0–40)

## 2021-03-13 LAB — TSH: TSH: 2.788 u[IU]/mL (ref 0.350–4.500)

## 2021-03-13 LAB — T4, FREE: Free T4: 1.05 ng/dL (ref 0.61–1.12)

## 2021-03-13 MED ORDER — BACITRACIN-NEOMYCIN-POLYMYXIN OINTMENT TUBE
TOPICAL_OINTMENT | Freq: Three times a day (TID) | CUTANEOUS | Status: DC
  Administered 2021-03-13: 1 via TOPICAL
  Filled 2021-03-13 (×2): qty 14.17

## 2021-03-13 MED ORDER — CHLORDIAZEPOXIDE HCL 25 MG PO CAPS
50.0000 mg | ORAL_CAPSULE | Freq: Four times a day (QID) | ORAL | Status: AC
  Administered 2021-03-13: 50 mg via ORAL
  Filled 2021-03-13: qty 2

## 2021-03-13 MED ORDER — CHLORDIAZEPOXIDE HCL 25 MG PO CAPS
50.0000 mg | ORAL_CAPSULE | Freq: Three times a day (TID) | ORAL | Status: DC
  Administered 2021-03-13 – 2021-03-14 (×2): 50 mg via ORAL
  Filled 2021-03-13 (×2): qty 2

## 2021-03-13 MED ORDER — CHLORDIAZEPOXIDE HCL 25 MG PO CAPS
25.0000 mg | ORAL_CAPSULE | Freq: Once | ORAL | Status: AC
  Administered 2021-03-13: 25 mg via ORAL

## 2021-03-13 MED ORDER — CHLORDIAZEPOXIDE HCL 25 MG PO CAPS: 25.0000 mg | ORAL_CAPSULE | ORAL | Status: DC

## 2021-03-13 MED ORDER — CHLORDIAZEPOXIDE HCL 25 MG PO CAPS: 25.0000 mg | ORAL_CAPSULE | Freq: Every day | ORAL | Status: DC

## 2021-03-13 NOTE — [facility] Suicide Risk Assessment (Signed)
[facility] INPATIENT:  Family/Significant Other Suicide Prevention Education  Suicide Prevention Education:  Education Completed; Ramzi Brathwaite [phone] (father) has been identified by the patient as the family member/significant other with whom the patient will be residing, and identified as the person(s) who will aid the patient in the event of a mental health crisis (suicidal ideations/suicide attempt).  With written consent from the patient, the family member/significant other has been provided the following suicide prevention education, prior to the and/or following the discharge of the patient.  The suicide prevention education provided includes the following: Suicide risk factors Suicide prevention and interventions National Suicide Hotline telephone number [facility] assessment telephone number [facility] Emergency Assistance 911 [facility] and/or Residential Mobile Crisis Unit telephone number  Request made of family/significant other to: Remove weapons (e.g., guns, rifles, knives), all items previously/currently identified as safety concern.   Remove drugs/medications (over-the-counter, prescriptions, illicit drugs), all items previously/currently identified as a safety concern.  The family member/significant other verbalizes understanding of the suicide prevention education information provided.  The family member/significant other agrees to remove the items of safety concern listed above.  CSW spoke with Mr. Chapa who states that his son has been struggling with substance use for "over 10 years".  Mr. Schetter states that his son has been to at least 20 different residential facilities for substance use in the past 10 years. He states that he has spent over "a quarter of a million dollars on residential centers".  Mr. Mach states that his son has tried outpatient therapies, medications, treatment centers for various lengths of time and "none of it  has helped".  Mr. Kinzler states that his son is also facing a divorce from his wife and homelessness due to the substance use.  He states that he does not believe a 30-day rehab center will help his son and is interested in EMDR or ECT or another therapy they have not tried in the past.  Mr. Slates state that his son has no access to weapons or firearms.  He also states that he does not know where his son will live after discharge and states that he is unable to have him in his home.  Mr. Schoonmaker believes that someone needs to watch his son daily to prevent him from using substances.  CSW completed SPE with Mr. Cadenhead.   Metro Kung [place] Seaborn 03/13/2021, 2:54 PM

## 2021-03-13 NOTE — Progress Notes (Signed)
Recreation Therapy Notes  Animal-Assisted Activity (AAA) Program Checklist/Progress Notes Patient Eligibility Criteria Checklist & Daily Group note for Rec Tx Intervention  Date: 7.26.22 Time: 1430 Location: [address]  AAA/T Program Assumption of Risk Form signed by Engineer, production or Parent Legal Guardian  YES   Patient is free of allergies or severe asthma YES   Patient reports no fear of animals YES  Patient reports no history of cruelty to animals YES  Patient understands his/her participation is voluntary YES  Patient washes hands before animal contact  YES  Patient washes hands after animal contact  YES  Behavioral Response: Engaged  Education: Charity fundraiser, Appropriate Animal Interaction   Education Outcome: Acknowledges understanding/In group clarification offered/Needs additional education.   Clinical Observations/Feedback: Pt attended and participated in group.     Caroll Rancher, LRT/CTRS        Caroll Rancher A 03/13/2021 3:14 PM

## 2021-03-13 NOTE — Progress Notes (Signed)
Did not attend group 

## 2021-03-13 NOTE — BHH Counselor (Signed)
CSW spoke with [place] Gopal [phone] (Wife) who states that her husband has been using substances throughout their entire relationship.  She states that she has a minor child in her home and does not want her husband to come back home until he has completed 90 days in a residental facility or [place] [facility], while also completing 90 days of outpatient substance use treatment.  Mrs. Murin states that her husband also have sponsors from [employer] like live locally but her husband continues to relapse on substances.  Mrs. Rouse states that she will be contacting her husband's father to discuss possible treatment options.

## 2021-03-13 NOTE — Progress Notes (Signed)
[facility] MD Progress Note  03/13/2021 4:36 PM Brandon [name]  MRN:  [id number]  Reason for admission:  Brandon [name] is a 45 year old male with a history of alcohol dependence, alcohol withdrawal, substance use disorder (cocaine, cannabis, remote methamphetamine, remote history of IVDU), PTSD, MDD versus bipolar disorder who presented on 03/11/2021 to [facility] with his father with suicidal ideation and reporting recent cutting of his forearms bilaterally with a knife several days prior in the context of recent relapse on alcohol and cocaine.  Patient was admitted for detox from alcohol and treatment for depression and substance use disorder.  Objective: Medical record reviewed.  Patient's case discussed in detail with members of the treatment team.  I met with and evaluated the patient on the unit today for follow-up.  Patient continues to present as depressed and anxious with constricted congruent affect.  He is mildly tremulous although tremor appears less prominent than yesterday.  Patient denies wish for death or suicidal ideation.  He reports that his mood is still depressed.  Patient states that Librium has been helpful in reducing his withdrawal symptoms.  He felt better by the end of the day yesterday but was more shaky first thing this morning due to the gap between the nighttime dose last night and the morning dose today.  I discussed with him that he does have as needed Librium coverage for withdrawal symptoms that meet parameters and encouraged him to ask nursing staff if he awakens in the middle the night.  Patient stated understanding.  He reports continued withdrawal symptoms of intermittent anxiety, nausea, body aches, headache, shakiness and sleep disturbance.  He has been eating and taking p.o. fluids.  He denies any vomiting or diarrhea.  He was able to sleep on and off last night.  Patient reports experiencing vague nonspecific paranoid ideation which is not delusional in degree.  He denies  hallucinations.  He is fully oriented.  Patient denies other physical symptoms.  The patient slept 6.5 hours last night.  Vital signs this morning include BP of 120/83 sitting 117/86 standing, pulse of 109 sitting and 111 standing and temperature of 97.5.  Vital signs at noon include BP of 103/80, pulse of 91 and respirations of 16.  Labs today include CMP with total bili of 1.3 and otherwise WNL.  Lipid panel with HDL of 32, triglycerides of 330, VLDL of 66 and otherwise WNL.  CBC and differential are WNL.  TSH and free T4 were WNL.  Hemoglobin A1c was drawn but results are not yet available.  Principal Problem: MDD (major depressive disorder), recurrent severe, without psychosis ([place]) Diagnosis: Principal Problem:   MDD (major depressive disorder), recurrent severe, without psychosis ([place]) Active Problems:   Alcohol dependence with withdrawal with complication ([place])  Total Time spent with patient:  25 minutes  Past Psychiatric History: See admission H&P  Past Medical History:  Past Medical History:  Diagnosis Date   Anxiety    Depression    Hyperlipidemia    Substance abuse ([place])    Thyroid disease    History reviewed. No pertinent surgical history. Family History:  Family History  Problem Relation Age of Onset   Diabetes Father    Family Psychiatric  History: See admission H&P Social History:  Social History   Substance and Sexual Activity  Alcohol Use Yes     Social History   Substance and Sexual Activity  Drug Use Yes   Types: "Crack" cocaine    Social History   Socioeconomic History  Marital status: Married    Spouse name: Not on file   Number of children: Not on file   Years of education: Not on file   Highest education level: Not on file  Occupational History   Not on file  Tobacco Use   Smoking status: Every Day    Types: Cigarettes   Smokeless tobacco: Never  Substance and Sexual Activity   Alcohol use: Yes   Drug use: Yes    Types: "Crack" cocaine    Sexual activity: Not Currently  Other Topics Concern   Not on file  Social History Narrative   Not on file   Social Determinants of Health   Financial Resource Strain: Not on file  Food Insecurity: Not on file  Transportation Needs: Not on file  Physical Activity: Not on file  Stress: Not on file  Social Connections: Not on file   Additional Social History:                         Sleep: Fair  Appetite:  Fair  Current Medications: Current Facility-Administered Medications  Medication Dose Route Frequency Provider Last Rate Last Admin   acetaminophen (TYLENOL) tablet 650 mg  650 mg Oral Q6H PRN Bobbitt, Shalon E, NP   650 mg at 03/13/21 0629   alum & mag hydroxide-simeth (MAALOX/MYLANTA) 200-200-20 MG/5ML suspension 30 mL  30 mL Oral Q4H PRN Bobbitt, Shalon E, NP       chlordiazePOXIDE (LIBRIUM) capsule 25 mg  25 mg Oral Q6H PRN Arthor Captain, MD       chlordiazePOXIDE (LIBRIUM) capsule 50 mg  50 mg Oral Q8H Arthor Captain, MD       Followed by   Derrill Memo ON 03/15/2021] chlordiazePOXIDE (LIBRIUM) capsule 25 mg  25 mg Oral Haroldine Laws, MD       Followed by   Derrill Memo ON 03/16/2021] chlordiazePOXIDE (LIBRIUM) capsule 25 mg  25 mg Oral Daily Arthor Captain, MD       hydrOXYzine (ATARAX/VISTARIL) tablet 25 mg  25 mg Oral Q6H PRN Arthor Captain, MD   25 mg at 03/12/21 2108   hydrOXYzine (ATARAX/VISTARIL) tablet 50 mg  50 mg Oral Q8H PRN Harlow Asa, MD   50 mg at 03/13/21 1326   levothyroxine (SYNTHROID) tablet 88 mcg  88 mcg Oral QAC breakfast Bobbitt, Shalon E, NP   88 mcg at 03/13/21 0865   loperamide (IMODIUM) capsule 2-4 mg  2-4 mg Oral PRN Bobbitt, Shalon E, NP   2 mg at 03/12/21 2103   magnesium hydroxide (MILK OF MAGNESIA) suspension 30 mL  30 mL Oral Daily PRN Bobbitt, Shalon E, NP       multivitamin with minerals tablet 1 tablet  1 tablet Oral Daily Bobbitt, Shalon E, NP   1 tablet at 03/13/21 7846   neomycin-bacitracin-polymyxin (NEOSPORIN)  ointment   Topical TID Arthor Captain, MD   Given at 03/13/21 1324   nicotine (NICODERM CQ - dosed in mg/24 hours) patch 21 mg  21 mg Transdermal Daily Nelda Marseille, Amy E, MD   21 mg at 03/13/21 0853   ondansetron (ZOFRAN-ODT) disintegrating tablet 4 mg  4 mg Oral Q6H PRN Bobbitt, Shalon E, NP   4 mg at 03/13/21 1326   pantoprazole (PROTONIX) EC tablet 40 mg  40 mg Oral Daily Bobbitt, Shalon E, NP   40 mg at 03/13/21 0851   thiamine tablet 100 mg  100 mg Oral Daily Bobbitt,  Lennie Muckle, NP   100 mg at 03/13/21 2694   traZODone (DESYREL) tablet 50 mg  50 mg Oral QHS PRN,MR X 1 Arthor Captain, MD   50 mg at 03/12/21 2106   vortioxetine HBr (TRINTELLIX) tablet 20 mg  20 mg Oral Daily Bobbitt, Shalon E, NP   20 mg at 03/13/21 8546    Lab Results:  Results for orders placed or performed during the hospital encounter of 03/12/21 (from the past 48 hour(s))  Lipid panel     Status: Abnormal   Collection Time: 03/13/21  6:40 AM  Result Value Ref Range   Cholesterol 170 0 - 200 mg/dL   Triglycerides 330 (H) <150 mg/dL   HDL 32 (L) >40 mg/dL   Total CHOL/HDL Ratio 5.3 RATIO   VLDL 66 (H) 0 - 40 mg/dL   LDL Cholesterol 72 0 - 99 mg/dL    Comment:        Total Cholesterol/HDL:CHD Risk Coronary Heart Disease Risk Table                     Men   Women  1/2 Average Risk   3.4   3.3  Average Risk       5.0   4.4  2 X Average Risk   9.6   7.1  3 X Average Risk  23.4   11.0        Use the calculated Patient Ratio above and the CHD Risk Table to determine the patient's CHD Risk.        ATP III CLASSIFICATION (LDL):  <100     mg/dL   Optimal  100-129  mg/dL   Near or Above                    Optimal  130-159  mg/dL   Borderline  160-189  mg/dL   High  >190     mg/dL   Very High Performed at [facility] [address], [place], [place] [address]   T4, free     Status: None   Collection Time: 03/13/21  6:40 AM  Result Value Ref Range   Free T4 1.05 0.61 - 1.12 ng/dL     Comment: (NOTE) Biotin ingestion may interfere with free T4 tests. If the results are inconsistent with the TSH level, previous test results, or the clinical presentation, then consider biotin interference. If needed, order repeat testing after stopping biotin. Performed at [facility], [place] [address] [place], [place] [address]   TSH     Status: None   Collection Time: 03/13/21  6:40 AM  Result Value Ref Range   TSH 2.788 0.350 - 4.500 uIU/mL    Comment: Performed by a 3rd Generation assay with a functional sensitivity of <=0.01 uIU/mL. Performed at [facility], [place] [address] [place], [place] [address]   Comprehensive metabolic panel     Status: Abnormal   Collection Time: 03/13/21  6:40 AM  Result Value Ref Range   Sodium 139 135 - 145 mmol/L   Potassium 4.1 3.5 - 5.1 mmol/L   Chloride 105 98 - 111 mmol/L   CO2 27 22 - 32 mmol/L   Glucose, Bld 90 70 - 99 mg/dL    Comment: Glucose reference range applies only to samples taken after fasting for at least 8 hours.   BUN 17 6 - 20 mg/dL   Creatinine, Ser 1.01 0.61 - 1.24 mg/dL  Calcium 9.0 8.9 - 10.3 mg/dL   Total Protein 6.7 6.5 - 8.1 g/dL   Albumin 4.0 3.5 - 5.0 g/dL   AST 21 15 - 41 U/L   ALT 27 0 - 44 U/L   Alkaline Phosphatase 66 38 - 126 U/L   Total Bilirubin 1.3 (H) 0.3 - 1.2 mg/dL   GFR, Estimated >60 >60 mL/min    Comment: (NOTE) Calculated using the CKD-EPI Creatinine Equation (2021)    Anion gap 7 5 - 15    Comment: Performed at [facility], [place] [address] [place], [place] [address]  CBC with Differential/Platelet     Status: None   Collection Time: 03/13/21  6:40 AM  Result Value Ref Range   WBC 6.8 4.0 - 10.5 K/uL   RBC 4.90 4.22 - 5.81 MIL/uL   Hemoglobin 15.6 13.0 - 17.0 g/dL   HCT 46.1 39.0 - 52.0 %   MCV 94.1 80.0 - 100.0 fL   MCH 31.8 26.0 - 34.0 pg   MCHC 33.8 30.0 - 36.0 g/dL   RDW 13.2 11.5 - 15.5 %   Platelets 279 150 - 400 K/uL   nRBC 0.0 0.0 -  0.2 %   Neutrophils Relative % 47 %   Neutro Abs 3.1 1.7 - 7.7 K/uL   Lymphocytes Relative 41 %   Lymphs Abs 2.8 0.7 - 4.0 K/uL   Monocytes Relative 10 %   Monocytes Absolute 0.7 0.1 - 1.0 K/uL   Eosinophils Relative 2 %   Eosinophils Absolute 0.1 0.0 - 0.5 K/uL   Basophils Relative 0 %   Basophils Absolute 0.0 0.0 - 0.1 K/uL   Immature Granulocytes 0 %   Abs Immature Granulocytes 0.01 0.00 - 0.07 K/uL    Comment: Performed at [facility], [place] [address] [place], [place] [address]    Blood Alcohol level:  Lab Results  Component Value Date   ETH 174 (H) 03/11/2021   ETH <10 96/78/9381    Metabolic Disorder Labs: No results found for: HGBA1C, MPG No results found for: PROLACTIN Lab Results  Component Value Date   CHOL 170 03/13/2021   TRIG 330 (H) 03/13/2021   HDL 32 (L) 03/13/2021   CHOLHDL 5.3 03/13/2021   VLDL 66 (H) 03/13/2021   LDLCALC 72 03/13/2021    Physical Findings: AIMS: Facial and Oral Movements Muscles of Facial Expression: None, normal Lips and Perioral Area: None, normal Jaw: None, normal Tongue: None, normal,Extremity Movements Upper (arms, wrists, hands, fingers): None, normal Lower (legs, knees, ankles, toes): None, normal, Trunk Movements Neck, shoulders, hips: None, normal, Overall Severity Severity of abnormal movements (highest score from questions above): None, normal Incapacitation due to abnormal movements: None, normal Patient's awareness of abnormal movements (rate only patient's report): No Awareness, Dental Status Current problems with teeth and/or dentures?: No Does patient usually wear dentures?: No  CIWA:  CIWA-Ar Total: 12 COWS:     Musculoskeletal: Strength & Muscle Tone: within normal limits Gait & Station: normal Patient leans: N/A  Psychiatric Specialty Exam:  Presentation  General Appearance: Casual; Other (comment) (Unkempt)  Eye Contact:Good  Speech:Clear and Coherent; Normal Rate  Speech  Volume:Normal  Handedness: No data recorded  Mood and Affect  Mood:Depressed; Anxious  Affect:Congruent   Thought Process  Thought Processes:Coherent; Goal Directed  Descriptions of Associations:Intact  Orientation:Full (Time, Place and Person)  Thought Content:Logical (Vague nonspecific paranoid ideation)  History of Schizophrenia/Schizoaffective disorder:No  Duration of Psychotic Symptoms:No data recorded Hallucinations:Hallucinations: None  Ideas  of Reference:None  Suicidal Thoughts:Suicidal Thoughts: No SI Passive Intent and/or Plan: Without Intent; Without Plan  Homicidal Thoughts:Homicidal Thoughts: No   Sensorium  Memory:Immediate Good; Recent Fair  Judgment:Good  Insight:Good   Executive Functions  Concentration:Fair  Attention Span:Fair  [place] of Knowledge:Good  Language:Good   Psychomotor Activity  Psychomotor Activity:Psychomotor Activity: Tremor (Less tremulous)   Assets  Assets:Communication Skills; Desire for Improvement; Housing; Social Support; Transportation   Sleep  Sleep:Sleep: Fair Number of Hours of Sleep: 6.5    Physical Exam: Physical Exam Vitals and nursing note reviewed.  Constitutional:      General: He is not in acute distress.    Appearance: He is not diaphoretic.  HENT:     Head: Normocephalic and atraumatic.  Cardiovascular:     Rate and Rhythm: Normal rate.  Pulmonary:     Effort: Pulmonary effort is normal.  Neurological:     General: No focal deficit present.     Mental Status: He is alert and oriented to person, place, and time.     Motor: Tremor present.   Review of Systems  Constitutional:  Positive for diaphoresis. Negative for chills and fever.  HENT:  Negative for sore throat.   Respiratory:  Negative for cough and shortness of breath.   Cardiovascular:  Negative for chest pain and palpitations.  Gastrointestinal:  Positive for nausea. Negative for vomiting.  Genitourinary:  Negative.   Musculoskeletal:  Positive for myalgias. Negative for falls.  Skin:  Negative for rash.  Neurological:  Positive for tremors and headaches. Negative for dizziness and seizures.  Psychiatric/Behavioral:  Positive for depression and substance abuse. Negative for hallucinations and suicidal ideas. The patient is nervous/anxious and has insomnia.   Blood pressure 103/80, pulse 91, temperature (!) 97.5 F (36.4 C), temperature source Oral, resp. rate 16, height 6' 4"  (1.93 m), weight 102.1 kg, SpO2 98 %. Body mass index is 27.39 kg/m.   Treatment Plan Summary: Daily contact with patient to assess and evaluate symptoms and progress in treatment and Medication management  Continue every 15-minute safety checks.  Alcohol use disorder/alcohol withdrawal -Continue CIWA protocol with Librium taper and PRN Librium for CIWA scores >10.  We will increase Librium doses to 50 mg every 8 hours x24 hours and then resume previously ordered Librium taper.  Depression -Continue Trintellix 20 mg daily  Hypothyroidism -Continue Synthroid 88 mcg daily  Insomnia -Continue trazodone 50 mg QHS PRN  Forearm lacerations -Applied Neosporin topically 3 times daily  GERD -New Protonix 40 mg daily  Discharge planning in progress.  Social work is investigating substance abuse treatment options and possible [place] for patient.     Arthor Captain, MD 03/13/2021, 4:36 PM

## 2021-03-13 NOTE — [facility] Group Notes (Signed)
[facility] Group Notes:  (Nursing/MHT/Case Management/Adjunct)  Date:  03/13/2021  Time:  8:30 PM  Type of Therapy:  Group Therapy  Participation Level:  Did Not Attend  Participation Quality:   na  Affect:   na  Cognitive:   na  Insight:  None  Engagement in Group:   na  Modes of Intervention:   na  Summary of Progress/Problems:  Lorita Officer 03/13/2021, 8:30 PM

## 2021-03-14 LAB — HEMOGLOBIN A1C
Hgb A1c MFr Bld: 5.4 % (ref 4.8–5.6)
Mean Plasma Glucose: 108 mg/dL

## 2021-03-14 MED ORDER — GABAPENTIN 100 MG PO CAPS
ORAL_CAPSULE | ORAL | Status: AC
  Filled 2021-03-14: qty 2

## 2021-03-14 MED ORDER — CHLORDIAZEPOXIDE HCL 25 MG PO CAPS: 25.0000 mg | ORAL_CAPSULE | Freq: Every day | ORAL | Status: DC

## 2021-03-14 MED ORDER — POLYETHYLENE GLYCOL 3350 17 G PO PACK
17.0000 g | PACK | Freq: Every day | ORAL | Status: DC
  Administered 2021-03-14: 17 g via ORAL
  Filled 2021-03-14 (×4): qty 1

## 2021-03-14 MED ORDER — LORATADINE 10 MG PO TABS
10.0000 mg | ORAL_TABLET | Freq: Every day | ORAL | Status: DC
  Administered 2021-03-14 – 2021-03-16 (×3): 10 mg via ORAL
  Filled 2021-03-14 (×6): qty 1

## 2021-03-14 MED ORDER — PRAZOSIN HCL 2 MG PO CAPS
2.0000 mg | ORAL_CAPSULE | Freq: Every day | ORAL | Status: DC
  Administered 2021-03-14 – 2021-03-15 (×2): 2 mg via ORAL
  Filled 2021-03-14 (×4): qty 1

## 2021-03-14 MED ORDER — LORAZEPAM 1 MG PO TABS: 1.0000 mg | ORAL_TABLET | ORAL | Status: DC | PRN

## 2021-03-14 MED ORDER — CHLORDIAZEPOXIDE HCL 25 MG PO CAPS
25.0000 mg | ORAL_CAPSULE | Freq: Three times a day (TID) | ORAL | Status: AC
  Filled 2021-03-14: qty 1

## 2021-03-14 MED ORDER — LORAZEPAM 1 MG PO TABS
2.0000 mg | ORAL_TABLET | Freq: Once | ORAL | Status: AC
  Administered 2021-03-14: 2 mg via ORAL

## 2021-03-14 MED ORDER — CHLORDIAZEPOXIDE HCL 25 MG PO CAPS: 25.0000 mg | ORAL_CAPSULE | ORAL | Status: AC

## 2021-03-14 MED ORDER — LORAZEPAM 1 MG PO TABS
ORAL_TABLET | ORAL | Status: AC
  Filled 2021-03-14: qty 2

## 2021-03-14 MED ORDER — GABAPENTIN 100 MG PO CAPS
200.0000 mg | ORAL_CAPSULE | Freq: Three times a day (TID) | ORAL | Status: DC
  Administered 2021-03-14 – 2021-03-16 (×4): 200 mg via ORAL
  Filled 2021-03-14 (×12): qty 2

## 2021-03-14 MED ORDER — OLANZAPINE 5 MG PO TBDP: 5.0000 mg | ORAL_TABLET | Freq: Three times a day (TID) | ORAL | Status: DC | PRN

## 2021-03-14 MED ORDER — ZIPRASIDONE MESYLATE 20 MG IM SOLR: 20.0000 mg | INTRAMUSCULAR | Status: DC | PRN

## 2021-03-14 NOTE — Progress Notes (Signed)
[facility] MD Progress Note  03/14/2021 3:52 PM Brandon [name]  MRN:  [id number]  Reason for admission:  Brandon [name] is a 45 year old male with a history of alcohol dependence, alcohol withdrawal, substance use disorder (cocaine, cannabis, remote methamphetamine, remote history of IVDU), PTSD, MDD versus bipolar disorder who presented on 03/11/2021 to [facility] with his father with suicidal ideation and reporting recent cutting of his forearms bilaterally with a knife several days prior in the context of recent relapse on alcohol and cocaine.  Patient was admitted for detox from alcohol and treatment for depression and substance use disorder.  Objective: Medical record reviewed.  Patient's case discussed in detail with members of the treatment team.  I met with and evaluated the patient on the unit today for follow-up.  Patient appears improved today with respect alcohol withdrawal.  There is no diaphoresis or tremor on exam.  He subjectively reports feeling better and experiencing fewer withdrawal symptoms.  Patient reports feeling intermittently shaky but states that his tremor is much improved from yesterday and he feels less anxious.  He denies nausea and vomiting.  Patient reports that he is eating and taking p.o. fluids well.  He continues to have restless sleep with "night terrors" and nightmares.  Patient describes his mood as still depressed and states that it is "situational."  He maintains good eye contact but presents as depressed, irritable and dysphoric.  Patient denies suicidal ideation, AI/HI or hallucinations.  Patient reported problems with nasal congestion and requested medication to alleviate it.  We discussed initiation of prazosin at bedtime for nightmares which patient has taken in the past and found to be effective.  I advised him that we will continue to taper off the Librium and patient stated understanding and agreement.  We discussed initiation of MiraLAX for constipation as MOM is not  effective thus far.  His superficial forearm lacerations are healing well without signs of infection.  Staff record the patient slept 6.75 hours last night.  After I met with patient this morning he received a phone call and learned that his wife is filing for divorce.  Patient became acutely upset, went to his room, punched the wall and punched his bedding.  Staff responded and were present in the room with patient to calm him.  He was seen by a nurse practitioner who ordered lorazepam 2 mg p.o. x1 and started gabapentin 200 mg 3x daily to help reduce anxiety.  Agitation protocol has been ordered.  Principal Problem: MDD (major depressive disorder), recurrent severe, without psychosis ([place]) Diagnosis: Principal Problem:   MDD (major depressive disorder), recurrent severe, without psychosis ([place]) Active Problems:   Alcohol dependence with withdrawal with complication ([place])  Total Time spent with patient:  25 minutes  Past Psychiatric History: See admission H&P  Past Medical History:  Past Medical History:  Diagnosis Date   Anxiety    Depression    Hyperlipidemia    Substance abuse ([place])    Thyroid disease    History reviewed. No pertinent surgical history. Family History:  Family History  Problem Relation Age of Onset   Diabetes Father    Family Psychiatric  History: See admission H&P Social History:  Social History   Substance and Sexual Activity  Alcohol Use Yes     Social History   Substance and Sexual Activity  Drug Use Yes   Types: "Crack" cocaine    Social History   Socioeconomic History   Marital status: Married    Spouse name: Not on  file   Number of children: Not on file   Years of education: Not on file   Highest education level: Not on file  Occupational History   Not on file  Tobacco Use   Smoking status: Every Day    Types: Cigarettes   Smokeless tobacco: Never  Substance and Sexual Activity   Alcohol use: Yes   Drug use: Yes    Types: "Crack"  cocaine   Sexual activity: Not Currently  Other Topics Concern   Not on file  Social History Narrative   Not on file   Social Determinants of Health   Financial Resource Strain: Not on file  Food Insecurity: Not on file  Transportation Needs: Not on file  Physical Activity: Not on file  Stress: Not on file  Social Connections: Not on file   Additional Social History:                         Sleep: Fair  Appetite:  Fair  Current Medications: Current Facility-Administered Medications  Medication Dose Route Frequency Provider Last Rate Last Admin   acetaminophen (TYLENOL) tablet 650 mg  650 mg Oral Q6H PRN Bobbitt, Shalon E, NP   650 mg at 03/14/21 0854   alum & mag hydroxide-simeth (MAALOX/MYLANTA) 200-200-20 MG/5ML suspension 30 mL  30 mL Oral Q4H PRN Bobbitt, Shalon E, NP       chlordiazePOXIDE (LIBRIUM) capsule 25 mg  25 mg Oral Q6H PRN Arthor Captain, MD       chlordiazePOXIDE (LIBRIUM) capsule 25 mg  25 mg Oral Q8H Arthor Captain, MD       Followed by   Derrill Memo ON 03/15/2021] chlordiazePOXIDE (LIBRIUM) capsule 25 mg  25 mg Oral Haroldine Laws, MD       Followed by   Derrill Memo ON 03/16/2021] chlordiazePOXIDE (LIBRIUM) capsule 25 mg  25 mg Oral Daily Arthor Captain, MD       gabapentin (NEURONTIN) capsule 200 mg  200 mg Oral TID Lindell Spar I, NP   200 mg at 03/14/21 1141   hydrOXYzine (ATARAX/VISTARIL) tablet 25 mg  25 mg Oral Q6H PRN Arthor Captain, MD   25 mg at 03/13/21 1721   levothyroxine (SYNTHROID) tablet 88 mcg  88 mcg Oral QAC breakfast Bobbitt, Shalon E, NP   88 mcg at 03/14/21 9628   loperamide (IMODIUM) capsule 2-4 mg  2-4 mg Oral PRN Bobbitt, Shalon E, NP   2 mg at 03/12/21 2103   loratadine (CLARITIN) tablet 10 mg  10 mg Oral Daily Arthor Captain, MD       OLANZapine zydis (ZYPREXA) disintegrating tablet 5 mg  5 mg Oral Q8H PRN Lindell Spar I, NP       And   LORazepam (ATIVAN) tablet 1 mg  1 mg Oral PRN Lindell Spar I, NP       And    ziprasidone (GEODON) injection 20 mg  20 mg Intramuscular PRN Nwoko, Agnes I, NP       magnesium hydroxide (MILK OF MAGNESIA) suspension 30 mL  30 mL Oral Daily PRN Bobbitt, Shalon E, NP   30 mL at 03/13/21 2058   multivitamin with minerals tablet 1 tablet  1 tablet Oral Daily Bobbitt, Shalon E, NP   1 tablet at 03/14/21 0851   neomycin-bacitracin-polymyxin (NEOSPORIN) ointment   Topical TID Arthor Captain, MD   Given at 03/13/21 1324   nicotine (NICODERM CQ - dosed in mg/24  hours) patch 21 mg  21 mg Transdermal Daily Nelda Marseille, Amy E, MD   21 mg at 03/14/21 0852   ondansetron (ZOFRAN-ODT) disintegrating tablet 4 mg  4 mg Oral Q6H PRN Bobbitt, Shalon E, NP   4 mg at 03/13/21 1326   pantoprazole (PROTONIX) EC tablet 40 mg  40 mg Oral Daily Bobbitt, Shalon E, NP   40 mg at 03/14/21 0851   polyethylene glycol (MIRALAX / GLYCOLAX) packet 17 g  17 g Oral Daily Arthor Captain, MD       prazosin (MINIPRESS) capsule 2 mg  2 mg Oral QHS Arthor Captain, MD       thiamine tablet 100 mg  100 mg Oral Daily Bobbitt, Shalon E, NP   100 mg at 03/14/21 0851   traZODone (DESYREL) tablet 50 mg  50 mg Oral QHS PRN,MR X 1 Arthor Captain, MD   50 mg at 03/13/21 2216   vortioxetine HBr (TRINTELLIX) tablet 20 mg  20 mg Oral Daily Bobbitt, Shalon E, NP   20 mg at 03/14/21 8921    Lab Results:  Results for orders placed or performed during the hospital encounter of 03/12/21 (from the past 48 hour(s))  Hemoglobin A1c     Status: None   Collection Time: 03/13/21  6:40 AM  Result Value Ref Range   Hgb A1c MFr Bld 5.4 4.8 - 5.6 %    Comment: (NOTE)         Prediabetes: 5.7 - 6.4         Diabetes: >6.4         Glycemic control for adults with diabetes: <7.0    Mean Plasma Glucose 108 mg/dL    Comment: (NOTE) Performed At: [facility] [place], [place] [id number] Rush Farmer MD KG:[id number]   Lipid panel     Status: Abnormal   Collection Time: 03/13/21  6:40 AM  Result Value Ref Range    Cholesterol 170 0 - 200 mg/dL   Triglycerides 330 (H) <150 mg/dL   HDL 32 (L) >40 mg/dL   Total CHOL/HDL Ratio 5.3 RATIO   VLDL 66 (H) 0 - 40 mg/dL   LDL Cholesterol 72 0 - 99 mg/dL    Comment:        Total Cholesterol/HDL:CHD Risk Coronary Heart Disease Risk Table                     Men   Women  1/2 Average Risk   3.4   3.3  Average Risk       5.0   4.4  2 X Average Risk   9.6   7.1  3 X Average Risk  23.4   11.0        Use the calculated Patient Ratio above and the CHD Risk Table to determine the patient's CHD Risk.        ATP III CLASSIFICATION (LDL):  <100     mg/dL   Optimal  100-129  mg/dL   Near or Above                    Optimal  130-159  mg/dL   Borderline  160-189  mg/dL   High  >190     mg/dL   Very High Performed at [facility] [address] [place], [place] [address]   T4, free     Status: None   Collection Time: 03/13/21  6:40 AM  Result  Value Ref Range   Free T4 1.05 0.61 - 1.12 ng/dL    Comment: (NOTE) Biotin ingestion may interfere with free T4 tests. If the results are inconsistent with the TSH level, previous test results, or the clinical presentation, then consider biotin interference. If needed, order repeat testing after stopping biotin. Performed at [facility], [place] [address] [place], [place] [address]   TSH     Status: None   Collection Time: 03/13/21  6:40 AM  Result Value Ref Range   TSH 2.788 0.350 - 4.500 uIU/mL    Comment: Performed by a 3rd Generation assay with a functional sensitivity of <=0.01 uIU/mL. Performed at [facility], [place] [address] [place], [place] [address]   Comprehensive metabolic panel     Status: Abnormal   Collection Time: 03/13/21  6:40 AM  Result Value Ref Range   Sodium 139 135 - 145 mmol/L   Potassium 4.1 3.5 - 5.1 mmol/L   Chloride 105 98 - 111 mmol/L   CO2 27 22 - 32 mmol/L   Glucose, Bld 90 70 - 99 mg/dL    Comment: Glucose reference range applies  only to samples taken after fasting for at least 8 hours.   BUN 17 6 - 20 mg/dL   Creatinine, Ser 1.01 0.61 - 1.24 mg/dL   Calcium 9.0 8.9 - 10.3 mg/dL   Total Protein 6.7 6.5 - 8.1 g/dL   Albumin 4.0 3.5 - 5.0 g/dL   AST 21 15 - 41 U/L   ALT 27 0 - 44 U/L   Alkaline Phosphatase 66 38 - 126 U/L   Total Bilirubin 1.3 (H) 0.3 - 1.2 mg/dL   GFR, Estimated >60 >60 mL/min    Comment: (NOTE) Calculated using the CKD-EPI Creatinine Equation (2021)    Anion gap 7 5 - 15    Comment: Performed at [facility], [place] [address], [place], [place] [address]  CBC with Differential/Platelet     Status: None   Collection Time: 03/13/21  6:40 AM  Result Value Ref Range   WBC 6.8 4.0 - 10.5 K/uL   RBC 4.90 4.22 - 5.81 MIL/uL   Hemoglobin 15.6 13.0 - 17.0 g/dL   HCT 46.1 39.0 - 52.0 %   MCV 94.1 80.0 - 100.0 fL   MCH 31.8 26.0 - 34.0 pg   MCHC 33.8 30.0 - 36.0 g/dL   RDW 13.2 11.5 - 15.5 %   Platelets 279 150 - 400 K/uL   nRBC 0.0 0.0 - 0.2 %   Neutrophils Relative % 47 %   Neutro Abs 3.1 1.7 - 7.7 K/uL   Lymphocytes Relative 41 %   Lymphs Abs 2.8 0.7 - 4.0 K/uL   Monocytes Relative 10 %   Monocytes Absolute 0.7 0.1 - 1.0 K/uL   Eosinophils Relative 2 %   Eosinophils Absolute 0.1 0.0 - 0.5 K/uL   Basophils Relative 0 %   Basophils Absolute 0.0 0.0 - 0.1 K/uL   Immature Granulocytes 0 %   Abs Immature Granulocytes 0.01 0.00 - 0.07 K/uL    Comment: Performed at [facility], [place] [address], [place], [place] [address]    Blood Alcohol level:  Lab Results  Component Value Date   ETH 174 (H) 03/11/2021   ETH <10 48/25/0037    Metabolic Disorder Labs: Lab Results  Component Value Date   HGBA1C 5.4 03/13/2021   MPG 108 03/13/2021   No results found for: PROLACTIN Lab Results  Component Value Date   CHOL 170 03/13/2021   TRIG 330 (H) 03/13/2021   HDL 32 (L) 03/13/2021   CHOLHDL 5.3 03/13/2021   VLDL 66 (H) 03/13/2021   LDLCALC 72 03/13/2021     Physical Findings: AIMS: Facial and Oral Movements Muscles of Facial Expression: None, normal Lips and Perioral Area: None, normal Jaw: None, normal Tongue: None, normal,Extremity Movements Upper (arms, wrists, hands, fingers): None, normal Lower (legs, knees, ankles, toes): None, normal, Trunk Movements Neck, shoulders, hips: None, normal, Overall Severity Severity of abnormal movements (highest score from questions above): None, normal Incapacitation due to abnormal movements: None, normal Patient's awareness of abnormal movements (rate only patient's report): No Awareness, Dental Status Current problems with teeth and/or dentures?: No Does patient usually wear dentures?: No  CIWA:  CIWA-Ar Total: 1 COWS:     Musculoskeletal: Strength & Muscle Tone: within normal limits Gait & Station: normal Patient leans: N/A  Psychiatric Specialty Exam:  Presentation  General Appearance: Casual  Eye Contact:Good  Speech:Clear and Coherent; Normal Rate  Speech Volume:Normal  Handedness: No data recorded  Mood and Affect  Mood:Dysphoric; Depressed; Anxious; Irritable  Affect:Congruent   Thought Process  Thought Processes:Coherent  Descriptions of Associations:Intact  Orientation:Full (Time, Place and Person)  Thought Content:Logical  History of Schizophrenia/Schizoaffective disorder:No  Duration of Psychotic Symptoms:No data recorded Hallucinations:Hallucinations: None  Ideas of Reference:None  Suicidal Thoughts:Suicidal Thoughts: No  Homicidal Thoughts:Homicidal Thoughts: No   Sensorium  Memory:Immediate Good; Recent Fair  Judgment:Fair  Insight:Fair   Executive Functions  Concentration:Fair  Attention Span:Fair  [place] of Knowledge:Good  Language:Good   Psychomotor Activity  Psychomotor Activity:Psychomotor Activity: Restlessness (No tremor observed today)   Assets  Assets:Communication Skills; Desire for Improvement;  Housing; Social Support; Transportation   Sleep  Sleep:Sleep: Fair Number of Hours of Sleep: 6.75    Physical Exam: Physical Exam Vitals and nursing note reviewed.  Constitutional:      General: He is not in acute distress.    Appearance: He is not diaphoretic.  HENT:     Head: Normocephalic and atraumatic.  Cardiovascular:     Rate and Rhythm: Normal rate.  Pulmonary:     Effort: Pulmonary effort is normal.  Neurological:     General: No focal deficit present.     Mental Status: He is alert and oriented to person, place, and time.     Motor: No tremor.   Review of Systems  Constitutional:  Negative for chills, diaphoresis and fever.  HENT:  Negative for sore throat.   Respiratory:  Negative for cough and shortness of breath.   Cardiovascular:  Negative for chest pain and palpitations.  Gastrointestinal:  Positive for constipation. Negative for diarrhea, nausea and vomiting.  Genitourinary: Negative.   Musculoskeletal:  Negative for falls and myalgias.  Skin:  Negative for rash.  Neurological:  Negative for dizziness, tremors, seizures and headaches.  Psychiatric/Behavioral:  Positive for depression and substance abuse. Negative for hallucinations and suicidal ideas. The patient is nervous/anxious and has insomnia.   All other systems reviewed and are negative. Blood pressure 129/89, pulse 89, temperature 97.9 F (36.6 C), temperature source Oral, resp. rate 18, height 6' 4"  (1.93 m), weight 102.1 kg, SpO2 98 %. Body mass index is 27.39 kg/m.   Treatment Plan Summary: Daily contact with patient to assess and evaluate symptoms and progress in treatment and Medication management  Continue every 15-minute safety checks.  Alcohol use disorder/alcohol withdrawal -Withdrawal signs/symptoms have improved and are currently adequately covered  with Librium taper -Continue CIWA protocol with Librium taper and PRN Librium for CIWA scores >10.  -Librium taper: Patient received  Librium 50 mg QAM this morning and is now on on Librium 25 mg every 8 hours x 3 doses.  Librium will then be decreased to 25 mg twice daily x 2 doses, then 25 mg daily x 1 dose with plan to discontinue thereafter.  Depression -Continue Trintellix 20 mg daily  Anxiety -Continue hydroxyzine 25 mg every 6 hours PRN -Start gabapentin 200 mg 3 times daily  Hypothyroidism -Continue Synthroid 88 mcg daily  Insomnia -Continue trazodone 50 mg QHS PRN -Start prazosin 2 mg at bedtime  Forearm lacerations -Continue Neosporin topically 3 times daily  Seasonal allergies/nasal congestion -Start Claritin 10 mg daily  Constipation -Start MiraLAX daily -Continue MOM as needed  GERD -Continue Protonix 40 mg daily  Agitation protocol per [facility]  Discharge planning in progress.  Social work is investigating substance abuse treatment options and possible [place] for patient.     Arthor Captain, MD 03/14/2021, 3:52 PM

## 2021-03-14 NOTE — [facility] Group Notes (Signed)
LCSW Group Therapy Note  03/14/2021   Type of Therapy and Topic:  Group Therapy - Healthy vs Unhealthy Coping Skills  Participation Level:  Minimal   Description of Group The focus of this group was to determine what unhealthy coping techniques typically are used by group members and what healthy coping techniques would be helpful in coping with various problems. Patients were guided in becoming aware of the differences between healthy and unhealthy coping techniques. Patients were asked to identify 2-3 healthy coping skills they would like to learn to use more effectively, and many mentioned meditation, breathing, and relaxation. These were explained, samples demonstrated, and resources shared for how to learn more at discharge. At group closing, additional ideas of healthy coping skills were shared in a fun exercise.  Therapeutic Goals Patients learned that coping is what human beings do all day long to deal with various situations in their lives Patients defined and discussed healthy vs unhealthy coping techniques Patients identified their preferred coping techniques and identified whether these were healthy or unhealthy Patients determined 2-3 healthy coping skills they would like to become more familiar with and use more often, and practiced a few medications Patients provided support and ideas to each other   Summary of Patient Progress:  Pt came to group shortly after introductions. Pt shared during discussion.    Therapeutic Modalities Cognitive Behavioral Therapy Motivational Interviewing  Fredirick Lathe, LCSWA Clinicial Social Worker [employer]

## 2021-03-14 NOTE — Progress Notes (Signed)
   03/13/21 2353  Psych Admission Type (Psych Patients Only)  Admission Status Voluntary  Psychosocial Assessment  Patient Complaints Anxiety  Eye Contact Fair  Facial Expression Flat  Affect Anxious;Depressed  Speech Logical/coherent  Interaction Assertive  Motor Activity Tremors (hands)  Appearance/Hygiene Unremarkable  Behavior Characteristics Cooperative  Mood Pleasant  Thought Process  Coherency WDL  Content WDL  Delusions WDL  Perception WDL (pt states, "has AVH post substance use (alcohol & cocaine))  Hallucination None reported or observed  Judgment Poor  Confusion None  Danger to Self  Current suicidal ideation? Denies  Danger to Others  Danger to Others None reported or observed

## 2021-03-14 NOTE — Progress Notes (Signed)
Recreation Therapy Notes  Date: 7.27.22 Time: 0930 Location: [address] Dayroom  Group Topic: Stress Management   Goal Area(s) Addresses:  Patient will actively participate in stress management techniques presented during session.  Patient will successfully identify benefit of practicing stress management post d/c.   Intervention: Guided exercise with ambient sound and script  Activity :Guided Imagery  LRT provided education, instruction, and demonstration on practice of visualization via guided imagery. Patients was asked to participate in the technique introduced during session. Patients were given suggestions of ways to access scripts post d/c and encouraged to explore Youtube and other apps available on smartphones, tablets, and computers.   Education:  Stress Management, Discharge Planning.   Education Outcome: Acknowledges education  Clinical Observations/Feedback: Patient did not attend group session.     Caroll Rancher, LRT/CTRS         Caroll Rancher A 03/14/2021 12:16 PM

## 2021-03-14 NOTE — Progress Notes (Signed)
Psychoeducational Group Note  Date:  03/14/2021 Time:  2059  Group Topic/Focus:  Wrap-Up Group:   The focus of this group is to help patients review their daily goal of treatment and discuss progress on daily workbooks.  Participation Level: Did Not Attend  Participation Quality:  Not Applicable  Affect:  Not Applicable  Cognitive:  Not Applicable  Insight:  Not Applicable  Engagement in Group: Not Applicable  Additional Comments: The patient did not attend the evening N.A. meeting.   Hazle Coca S 03/14/2021, 8:59 PM

## 2021-03-15 ENCOUNTER — Encounter (HOSPITAL_COMMUNITY): Payer: Self-pay | Admitting: Nurse Practitioner

## 2021-03-15 NOTE — Progress Notes (Signed)
[facility] MD Progress Note  03/15/2021 5:16 PM Brandon [name]  MRN:  [id number]  Reason for admission:  Brandon [name] is a 45 year old male with a history of alcohol dependence, alcohol withdrawal, substance use disorder (cocaine, cannabis, remote methamphetamine, remote history of IVDU), PTSD, MDD versus bipolar disorder who presented on 03/11/2021 to [facility] with his father with suicidal ideation and reporting recent cutting of his forearms bilaterally with a knife several days prior in the context of recent relapse on alcohol and cocaine.  Patient was admitted for detox from alcohol and treatment for depression and substance use disorder.  Objective: Medical record reviewed.  Patient's case discussed in detail with members of the treatment team.  I met with and evaluated the patient on the unit today for follow-up.  Patient appears improved today.  He presents as calmer, less anxious, less depressed with brighter more stable affect.  There is no significant irritability noted today.  Patient is not displaying any diaphoresis, tremor or other signs of alcohol withdrawal.  He is denying any withdrawal symptoms.  Patient states that he had a rough day yesterday after learning that his wife filed for divorce.  He got upset and punched the wall which patient states he believes was a normal response for anyone receiving such news from their spouse.  He shows me a minor abrasion over his knuckle but denies any pain.  Patient states he was able to collect himself after punching the wall and he now feels better about his future.  He plans to live with his father after discharge.  He reports improved sleep last night and feels prazosin was helpful for nightmares.  He denies passive wish for death, SI, AI or HI.  He denies any PI or hallucinations.  Patient reports that he is eating well.  We discussed possible change in his medication for mood symptoms.  Patient states he has taken Trintellix for the past 4 to 5 months  but is uncertain as to whether it works because he has been using substances intermittently.  When he is not using substances, patient states he does not feel depressed.  He feels much if not all of his depression is due to his substance use.  Patient states that he took Cymbalta for 8 years in the past and thinks he may have done the best on Cymbalta.  He declines change in antidepressant from Trintellix to Cymbalta during this hospitalization.  We discussed patient's past trials of mood stabilizing medications for possible bipolar disorder.  Patient gives conflicting responses to questions regarding possible prior periods consistent with hypomania or mania.  Patient states that he felt that his past periods of decreased need for sleep and increased activity were related to his substance use.  He has taken multiple mood stabilizers in the past.  We discussed initiating an atypical antipsychotic, lithium or an anticonvulsant for mood stabilization.  Patient declines to start any mood stabilizer at this time.  Patient states he will consider making changes as an outpatient.  He is eager for discharge in the near future and hopes he can be discharged tomorrow.  Patient states constipation and nasal congestion have resolved and he denies any medication side effects or physical problems.  Staff document the patient slept 6.5 hours last night.  Staff report that patient has been refusing his standing dose Librium taper since yesterday afternoon.  He is also been refusing his gabapentin.  Principal Problem: MDD (major depressive disorder), recurrent severe, without psychosis ([place]) Diagnosis: Principal Problem:  MDD (major depressive disorder), recurrent severe, without psychosis ([place]) Active Problems:   Alcohol dependence with withdrawal with complication ([place])  Total Time spent with patient:  25 minutes  Past Psychiatric History: See admission H&P  Past Medical History:  Past Medical History:  Diagnosis  Date   Anxiety    Depression    Hyperlipidemia    Substance abuse ([place])    Thyroid disease    History reviewed. No pertinent surgical history. Family History:  Family History  Problem Relation Age of Onset   Diabetes Father    Family Psychiatric  History: See admission H&P Social History:  Social History   Substance and Sexual Activity  Alcohol Use Yes     Social History   Substance and Sexual Activity  Drug Use Yes   Types: "Crack" cocaine    Social History   Socioeconomic History   Marital status: Married    Spouse name: Not on file   Number of children: Not on file   Years of education: Not on file   Highest education level: Not on file  Occupational History   Not on file  Tobacco Use   Smoking status: Every Day    Types: Cigarettes   Smokeless tobacco: Never  Substance and Sexual Activity   Alcohol use: Yes   Drug use: Yes    Types: "Crack" cocaine   Sexual activity: Not Currently  Other Topics Concern   Not on file  Social History Narrative   Not on file   Social Determinants of Health   Financial Resource Strain: Not on file  Food Insecurity: Not on file  Transportation Needs: Not on file  Physical Activity: Not on file  Stress: Not on file  Social Connections: Not on file   Additional Social History:                         Sleep: Fair  Appetite:  Fair  Current Medications: Current Facility-Administered Medications  Medication Dose Route Frequency Provider Last Rate Last Admin   acetaminophen (TYLENOL) tablet 650 mg  650 mg Oral Q6H PRN Bobbitt, Shalon E, NP   650 mg at 03/14/21 0854   alum & mag hydroxide-simeth (MAALOX/MYLANTA) 200-200-20 MG/5ML suspension 30 mL  30 mL Oral Q4H PRN Bobbitt, Shalon E, NP   30 mL at 03/15/21 1123   chlordiazePOXIDE (LIBRIUM) capsule 25 mg  25 mg Oral Haroldine Laws, MD       Followed by   Derrill Memo ON 03/16/2021] chlordiazePOXIDE (LIBRIUM) capsule 25 mg  25 mg Oral Daily Arthor Captain,  MD       gabapentin (NEURONTIN) capsule 200 mg  200 mg Oral TID Lindell Spar I, NP   200 mg at 03/14/21 1141   levothyroxine (SYNTHROID) tablet 88 mcg  88 mcg Oral QAC breakfast Bobbitt, Shalon E, NP   88 mcg at 03/15/21 1740   loratadine (CLARITIN) tablet 10 mg  10 mg Oral Daily Arthor Captain, MD   10 mg at 03/15/21 8144   magnesium hydroxide (MILK OF MAGNESIA) suspension 30 mL  30 mL Oral Daily PRN Bobbitt, Shalon E, NP   30 mL at 03/13/21 2058   multivitamin with minerals tablet 1 tablet  1 tablet Oral Daily Bobbitt, Shalon E, NP   1 tablet at 03/15/21 0912   neomycin-bacitracin-polymyxin (NEOSPORIN) ointment   Topical TID Arthor Captain, MD   Given at 03/15/21 [phone]   nicotine (NICODERM CQ - dosed  in mg/24 hours) patch 21 mg  21 mg Transdermal Daily Nelda Marseille, Amy E, MD   21 mg at 03/15/21 0917   OLANZapine zydis (ZYPREXA) disintegrating tablet 5 mg  5 mg Oral Q8H PRN Lindell Spar I, NP       And   ziprasidone (GEODON) injection 20 mg  20 mg Intramuscular PRN Nwoko, Agnes I, NP       pantoprazole (PROTONIX) EC tablet 40 mg  40 mg Oral Daily Bobbitt, Shalon E, NP   40 mg at 03/15/21 0918   polyethylene glycol (MIRALAX / GLYCOLAX) packet 17 g  17 g Oral Daily Arthor Captain, MD   17 g at 03/14/21 1628   prazosin (MINIPRESS) capsule 2 mg  2 mg Oral QHS Arthor Captain, MD   2 mg at 03/14/21 2125   thiamine tablet 100 mg  100 mg Oral Daily Bobbitt, Shalon E, NP   100 mg at 03/15/21 0912   traZODone (DESYREL) tablet 50 mg  50 mg Oral QHS PRN,MR X 1 Arthor Captain, MD   50 mg at 03/13/21 2216   vortioxetine HBr (TRINTELLIX) tablet 20 mg  20 mg Oral Daily Bobbitt, Shalon E, NP   20 mg at 03/15/21 1000    Lab Results:  No results found for this or any previous visit (from the past 48 hour(s)).   Blood Alcohol level:  Lab Results  Component Value Date   ETH 174 (H) 03/11/2021   ETH <10 06/15/2535    Metabolic Disorder Labs: Lab Results  Component Value Date   HGBA1C 5.4 03/13/2021    MPG 108 03/13/2021   No results found for: PROLACTIN Lab Results  Component Value Date   CHOL 170 03/13/2021   TRIG 330 (H) 03/13/2021   HDL 32 (L) 03/13/2021   CHOLHDL 5.3 03/13/2021   VLDL 66 (H) 03/13/2021   LDLCALC 72 03/13/2021    Physical Findings: AIMS: Facial and Oral Movements Muscles of Facial Expression: None, normal Lips and Perioral Area: None, normal Jaw: None, normal Tongue: None, normal,Extremity Movements Upper (arms, wrists, hands, fingers): None, normal Lower (legs, knees, ankles, toes): None, normal, Trunk Movements Neck, shoulders, hips: None, normal, Overall Severity Severity of abnormal movements (highest score from questions above): None, normal Incapacitation due to abnormal movements: None, normal Patient's awareness of abnormal movements (rate only patient's report): No Awareness, Dental Status Current problems with teeth and/or dentures?: No Does patient usually wear dentures?: No  CIWA:  CIWA-Ar Total: 10 COWS:     Musculoskeletal: Strength & Muscle Tone: within normal limits Gait & Station: normal Patient leans: N/A  Psychiatric Specialty Exam:  Presentation  General Appearance: Casual  Eye Contact:Good  Speech:Clear and Coherent; Normal Rate  Speech Volume:Normal  Handedness: No data recorded  Mood and Affect  Mood:Anxious (improved)  Affect:Appropriate Programmer, applications)   Thought Process  Thought Processes:Coherent; Goal Directed  Descriptions of Associations:Intact (Intermittently tangential but redirectable)  Orientation:Full (Time, Place and Person)  Thought Content:Logical; WDL; Tangential (Intermittently tangential but redirectable)  History of Schizophrenia/Schizoaffective disorder:No  Duration of Psychotic Symptoms:No data recorded Hallucinations:Hallucinations: None  Ideas of Reference:None  Suicidal Thoughts:Suicidal Thoughts: No  Homicidal Thoughts:Homicidal Thoughts: No   Sensorium  Memory:Immediate Good;  Recent Good  Judgment:Fair  Insight:Fair   Executive Functions  Concentration:Fair  Attention Span:Fair  [place] of Knowledge:Good  Language:Good   Psychomotor Activity  Psychomotor Activity:Psychomotor Activity: Normal   Assets  Assets:Communication Skills; Desire for Improvement; Social Support; Housing; Transportation   Sleep  Sleep:Sleep: Good Number of Hours of Sleep: 6.5    Physical Exam: Physical Exam Vitals and nursing note reviewed.  Constitutional:      General: He is not in acute distress.    Appearance: He is not diaphoretic.  HENT:     Head: Normocephalic and atraumatic.  Cardiovascular:     Rate and Rhythm: Normal rate.  Pulmonary:     Effort: Pulmonary effort is normal.  Neurological:     General: No focal deficit present.     Mental Status: He is alert and oriented to person, place, and time.     Motor: No tremor.   Review of Systems  Constitutional:  Negative for chills, diaphoresis and fever.  HENT:  Negative for sore throat.   Respiratory:  Negative for cough and shortness of breath.   Cardiovascular:  Negative for chest pain and palpitations.  Gastrointestinal:  Negative for diarrhea, nausea and vomiting.  Genitourinary: Negative.   Musculoskeletal:  Negative for falls and myalgias.  Skin:  Negative for rash.  Neurological:  Negative for dizziness, tremors, seizures and headaches.  Psychiatric/Behavioral:  Positive for substance abuse. Negative for depression, hallucinations and suicidal ideas. The patient is nervous/anxious and has insomnia.   All other systems reviewed and are negative. Blood pressure 123/85, pulse (!) 102, temperature (!) 97.5 F (36.4 C), temperature source Oral, resp. rate 18, height _0  (1.93 m), weight 102.1 kg, SpO2 99 %. Body mass index is 27.39 kg/m.   Treatment Plan Summary: Daily contact with patient to assess and evaluate symptoms and progress in treatment and Medication  management  Continue every 15-minute safety checks.  Alcohol use disorder/alcohol withdrawal -Withdrawal signs/symptoms have improved and are currently adequately covered with Librium taper -Continue CIWA protocol with Librium taper and PRN Librium for CIWA scores >10.  -Librium taper: Patient received Librium 50 mg QAM this morning and is now on on Librium 25 mg every 8 hours x 3 doses.  Librium will then be decreased to 25 mg twice daily x 2 doses, then 25 mg daily x 1 dose with plan to discontinue thereafter.  Depression -Continue Trintellix 20 mg daily  Anxiety -Continue hydroxyzine 25 mg every 6 hours PRN -Continue gabapentin 200 mg 3 times daily  Hypothyroidism -Continue Synthroid 88 mcg daily  Insomnia -Continue trazodone 50 mg QHS PRN -Continue prazosin 2 mg at bedtime  Forearm lacerations -Continue Neosporin topically 3 times daily  Seasonal allergies/nasal congestion -Continue Claritin 10 mg daily  Constipation -Discontinue MiraLAX daily -Continue MOM as needed  GERD -Continue Protonix 40 mg daily  Agitation protocol per [facility]  Discharge planning in progress.  Social work is investigating substance abuse treatment options and possible [place] for patient.     Arthor Captain, MD 03/15/2021, 5:16 PM

## 2021-03-15 NOTE — Progress Notes (Signed)
   03/14/21 2125  Psych Admission Type (Psych Patients Only)  Admission Status Voluntary  Psychosocial Assessment  Patient Complaints Anxiety;Sadness  Eye Contact Fair  Facial Expression Anxious;Animated  Affect Appropriate to circumstance;Sad  Speech Logical/coherent  Interaction Assertive  Motor Activity Other (Comment) (WDL)  Appearance/Hygiene Unremarkable  Behavior Characteristics Cooperative;Appropriate to situation  Mood Pleasant  Thought Process  Coherency WDL  Content WDL  Delusions None reported or observed  Perception WDL  Hallucination None reported or observed  Judgment Poor  Confusion None  Danger to Self  Current suicidal ideation? Denies  Danger to Others  Danger to Others None reported or observed

## 2021-03-15 NOTE — Progress Notes (Signed)
   03/15/21 0830  Psych Admission Type (Psych Patients Only)  Admission Status Voluntary  Psychosocial Assessment  Patient Complaints Anxiety  Eye Contact Fair  Facial Expression Anxious;Animated  Affect Appropriate to circumstance;Sad  Speech Logical/coherent  Interaction Assertive  Motor Activity Other (Comment) (WDL)  Appearance/Hygiene Unremarkable  Behavior Characteristics Appropriate to situation;Cooperative  Mood Pleasant  Thought Process  Coherency WDL  Content WDL  Delusions None reported or observed  Perception WDL  Hallucination None reported or observed  Judgment Poor  Confusion None  Danger to Self  Current suicidal ideation? Denies  Danger to Others  Danger to Others None reported or observed      COVID-19 Daily Checkoff  Have you had a fever (temp > 37.80C/100F)  in the past 24 hours?  No  If you have had runny nose, nasal congestion, sneezing in the past 24 hours, has it worsened? No  COVID-19 EXPOSURE  Have you traveled outside the state in the past 14 days? No  Have you been in contact with someone with a confirmed diagnosis of COVID-19 or PUI in the past 14 days without wearing appropriate PPE? No  Have you been living in the same home as a person with confirmed diagnosis of COVID-19 or a PUI (household contact)? No  Have you been diagnosed with COVID-19? No

## 2021-03-15 NOTE — [facility] Group Notes (Signed)
Adult Psychoeducational Group Note  Date:  03/15/2021 Time:  10:45 AM  Group Topic/Focus:  Goals Group:   The focus of this group is to help patients establish daily goals to achieve during treatment and discuss how the patient can incorporate goal setting into their daily lives to aide in recovery.  Participation Level:  Active  Participation Quality:  Appropriate  Affect:  Appropriate  Cognitive:  Appropriate  Insight: Appropriate  Engagement in Group:  Engaged  Modes of Intervention:  Discussion  Additional Comments:  Patient attended morning goal group and said that his goal for today is to discuss his discharged plan.   Tearia Gibbs W Vernita Tague 03/15/2021, 10:45 AM

## 2021-03-15 NOTE — Progress Notes (Signed)
Adult Psychoeducational Group Note  Date:  03/15/2021 Time:  8:59 PM  Group Topic/Focus:  Wrap-Up Group:   The focus of this group is to help patients review their daily goal of treatment and discuss progress on daily workbooks.  Participation Level:  Active  Participation Quality:  Appropriate  Affect:  Appropriate  Cognitive:  Appropriate  Insight: Appropriate  Engagement in Group:  Engaged  Modes of Intervention:  Discussion  Additional Comments:  patient said his day was a 10. His goal for today was to stay active. And he achieved his goal. Coping skills was meditate  Chauncey Fischer 03/15/2021, 8:59 PM

## 2021-03-15 NOTE — BHH Counselor (Signed)
CSW spoke with Brandon [name] (father) who states that at discharge his son will come to live with him and that he will be the one picking his son up from the hospital.  He states that he is working on getting his son enrolled in a EMDR program and a Biotherapy program.  [name] states that he has removed or locked away all firearms and weapons and is going to help his son financially while he is living in the home.  CSW explained to [name] that the doctor does not believe ECT will be helpful for their son at this time and that the medications used for withdraw prevent a patient from completing ECT (librium).  [name] stated that he understood and that he would continue to look at other programs and outpatient opportunities that would benefit his son.

## 2021-03-16 MED ORDER — NICOTINE 21 MG/24HR TD PT24: 21.0000 mg | MEDICATED_PATCH | Freq: Every day | TRANSDERMAL | 0 refills | Status: AC

## 2021-03-16 MED ORDER — LORATADINE 10 MG PO TABS: 10.0000 mg | ORAL_TABLET | Freq: Every day | ORAL | Status: AC

## 2021-03-16 MED ORDER — PRAZOSIN HCL 2 MG PO CAPS: 2.0000 mg | ORAL_CAPSULE | Freq: Every day | ORAL | 0 refills | Status: AC

## 2021-03-16 MED ORDER — BACITRACIN-NEOMYCIN-POLYMYXIN OINTMENT TUBE: 1.0000 "application " | TOPICAL_OINTMENT | Freq: Three times a day (TID) | CUTANEOUS | Status: AC

## 2021-03-16 MED ORDER — LAMOTRIGINE 25 MG PO TABS
25.0000 mg | ORAL_TABLET | Freq: Every day | ORAL | Status: DC
  Administered 2021-03-16: 25 mg via ORAL
  Filled 2021-03-16 (×4): qty 1

## 2021-03-16 MED ORDER — TRAZODONE HCL 50 MG PO TABS: 50.0000 mg | ORAL_TABLET | Freq: Every evening | ORAL | 0 refills | Status: AC | PRN

## 2021-03-16 MED ORDER — LAMOTRIGINE 25 MG PO TABS: 25.0000 mg | ORAL_TABLET | Freq: Every day | ORAL | 0 refills | Status: AC

## 2021-03-16 MED ORDER — GABAPENTIN 100 MG PO CAPS: 200.0000 mg | ORAL_CAPSULE | Freq: Three times a day (TID) | ORAL | 0 refills | Status: AC

## 2021-03-16 MED ORDER — TRINTELLIX 20 MG PO TABS: 20.0000 mg | ORAL_TABLET | Freq: Every day | ORAL | 0 refills | Status: AC

## 2021-03-16 NOTE — Progress Notes (Signed)
   03/15/21 2111  Psych Admission Type (Psych Patients Only)  Admission Status Voluntary  Psychosocial Assessment  Patient Complaints Anxiety;Sadness  Eye Contact Fair  Facial Expression Anxious;Animated  Affect Appropriate to circumstance  Speech Logical/coherent  Interaction Assertive  Motor Activity Other (Comment) (WDL)  Appearance/Hygiene Unremarkable  Behavior Characteristics Cooperative;Appropriate to situation  Mood Pleasant  Thought Process  Coherency WDL  Content WDL  Delusions None reported or observed  Perception WDL  Hallucination None reported or observed  Judgment Poor  Confusion None  Danger to Self  Current suicidal ideation? Denies  Danger to Others  Danger to Others None reported or observed

## 2021-03-16 NOTE — Progress Notes (Addendum)
Discharge Note:   Patient discharged home with his dad.  Suicide prevention information given and discussed with patient who stated he understood and had no questions.  Patient denied SI and HI, contracts for safety.  Denied A/V hallucinations. Medications administered per MD orders.  Emotional support and encouragement given patient. Safety maintained with 15 minute checks.

## 2021-03-16 NOTE — Discharge Summary (Signed)
Physician Discharge Summary Note  Patient:  Brandon [name] is an 45 y.o., male MRN:  [id number] DOB:  10/28/1975 Patient phone:  [phone] (home)  Patient address:   [address] [place] [address],  Total Time spent with patient:  Greater than 30 minutes  Date of Admission:  03/12/2021  Date of Discharge: 03-16-21  Reason for Admission: Suicidal ideation and reporting recent cutting of his forearms bilaterally with a knife several days prior in the context of recent relapse on alcohol and cocaine.    Principal Problem: MDD (major depressive disorder), recurrent severe, without psychosis (HCC)  Discharge Diagnoses: Principal Problem:   MDD (major depressive disorder), recurrent severe, without psychosis (HCC) Active Problems:   Alcohol dependence with withdrawal with complication (HCC)  Past Psychiatric History: Major depressive disorder, Alcohol use disorder, Cocaine use disorder  Past Medical History:  Past Medical History:  Diagnosis Date   Anxiety    Depression    Hyperlipidemia    Substance abuse (HCC)    Thyroid disease    History reviewed. No pertinent surgical history.  Family History:  Family History  Problem Relation Age of Onset   Diabetes Father    Family Psychiatric  History: See H&P  Social History:  Social History   Substance and Sexual Activity  Alcohol Use Yes     Social History   Substance and Sexual Activity  Drug Use Yes   Types: "Crack" cocaine    Social History   Socioeconomic History   Marital status: Married    Spouse name: Not on file   Number of children: Not on file   Years of education: Not on file   Highest education level: Not on file  Occupational History   Not on file  Tobacco Use   Smoking status: Every Day    Types: Cigarettes   Smokeless tobacco: Never  Substance and Sexual Activity   Alcohol use: Yes   Drug use: Yes    Types: "Crack" cocaine   Sexual activity: Not Currently  Other Topics Concern    Not on file  Social History Narrative   Not on file   Social Determinants of Health   Financial Resource Strain: Not on file  Food Insecurity: Not on file  Transportation Needs: Not on file  Physical Activity: Not on file  Stress: Not on file  Social Connections: Not on file   Hospital Course: (Per Md's admission evaluation notes):  Brandon [name] is a 45 year old male with a history of alcohol dependence, alcohol withdrawal, substance use disorder (cocaine, cannabis, remote methamphetamine, remote history of IVDU), PTSD, MDD versus bipolar disorder who presented on 03/11/2021 to [facility] with his father with suicidal ideation and reporting recent cutting of his forearms bilaterally with a knife several days prior in the context of recent relapse on alcohol and cocaine.  Most recent drink was approximately 7 PM on 03/11/2021.  Patient was sent to [facility] to be assessed for medical stability prior to admission to [facility].  In the ED BAL was 174 and UDS was positive for cocaine.  Patient was hydrated with several liters of fluid, started on oral lorazepam to prevent alcohol withdrawal and given IV labetalol.  He was transferred to [facility] for detox from alcohol and treatment of depression and suicidal ideation.  On evaluation today, patient is cooperative and pleasant but appears tremulous, anxious, depressed and shaky with constricted affect.  He reports recently being discharged from a detox facility 5 days ago and has been consuming alcohol  daily since that time and using cocaine for the past 3 to 4 days.  Patient reports use of alcohol "around-the-clock" and estimates consuming 14-24 beers or 1/5 of vodka per day for the last 5 days.  Patient estimates use of approximately $300 per day of cocaine over the last 3 to 4 days.  He used meth on 1 occasion in recent days.  He denies any other recent drug use other than infrequent marijuana use.  Patient reports a history of intermittent hospitalizations over the last 20  years related to substance use and states that he has been having 64-month cycles of becoming sober and then relapsing in recent years.  He also reports a history of depression since age 89 which comes and goes.  Patient states that he had been doing well 9 or 10 months ago and is newly married.  He is now worried that because of his relapse he would lose his marriage.  He reports symptoms of depressed mood, difficulty sleeping, decreased appetite, suicidal ideation.  He states that he cut his wrists with a knife bilaterally and sat in the bathtub with the goal of bleeding to death.  Patient denies suicidal ideation now.  He denies current AI, HI, AH, VH or PI.  He reports a history of alcohol withdrawal in the past with hallucinations but denies any history of seizures.  He reports current withdrawal symptoms of tremor, feeling shaky, nausea, diarrhea, anxiety, chills and body aches.  Patient is interested in going to residential substance use disorder treatment program after discharge.  Patient reports most recently being treated with a combination of trazodone, Trintellix and levothyroxine (for history of hypothyroidism).   Prior to this discharge, Brandon [name] was seen & evaluated for mental health stability. The current laboratory findings were reviewed (stable). The nurses notes & vital signs were reviewed as well. There are no current mental health or medical issues that should prevent this discharge at this time. Patient is being discharged to continue mental health care as noted below.   This is one of several psychiatric admissions/discharge summaries from this [facility] for this 45 year old Caucasian male with hx of chronic mental illness, polysubstance use disorders & multiple psychiatric admissions. He is known in this [facility] & other psychiatric hospital within the surrounding areas for worsening symptoms of his mental health issues & substance abuse related problems. Arth has been tried on multiple psychotropic  medications for his symptoms & it appeared his symptoms has not been able to improve due to probable non-compliance issues & relapses on substances, pre-disposing him to recurrent of symptoms & frequent hospitalizations. He was brought to the [facility] this time around for evaluation & treatment for worsening suicidal ideation and reporting recent cutting of his forearms bilaterally with a knife several days prior in the context of recent relapse on alcohol and cocaine. His UDS on admission was positive for cocaine & a BAL of 174 per toxicology reports.  After evaluation of his presenting symptoms this time around, Kadeen was recommended for mood stabilization/detoxification  treatments. The medication regimen for his presenting symptoms were discussed & with his consent initiated. He received Librium detoxification treatment protocols for alcohol withdrawal symptoms management. He was also medicated, stabilized & was discharged on the medications as listed below on his discharge medication lists. He was enrolled & participated in the group counseling sessions being offered & held on this unit. He learned coping skills. He presented on this admission, other chronic medical conditions that required treatment &  monitoring. He was resumed & discharged on his pertinent home medications for those health issues. He tolerated his treatment regimen without any adverse effects or reactions reported.  During the course of this present hospitalization, the 15-minute checks were adequate to ensure Brandon [name]'s safety.  Patient did displayed one incident of an outburst & punched his hand on the wall bruising the back of his fingers. This happened after he got very upset after speaking with his wife over the phone & learned that she has filed for divorce. He was medicated for it. After that incident, there were not any more dangerous, violent or suicidal behaviors displayed on the unit. He interacted with patients & staff appropriately  from there onwards. He participated appropriately in the group sessions/therapies. His medications were addressed & adjusted to meet his needs. He was recommended for outpatient follow-up care & medication management upon discharge to assure his continuity of care.  At the time of this discharge, patient is not reporting any acute suicidal/homicidal ideations. He currently denies any new issues or concerns. Education and supportive counseling provided throughout her hospital stay & upon discharge.  Today upon his discharge evaluation with the attending psychiatrist, [name] presents mentally & medically stable. He denies any other specific concerns. He is sleeping well. His appetite is good. He denies other physical complaints. He denies AH/VH, delusional thoughts or paranoia. He feels that his medications have been helpful & is in agreement to continue his current treatment regimen as recommended. He was able to engage in safety planning including plan [facility] or contact emergency services if he feels unable to maintain his own safety or the safety of others. Pt had no further questions, comments, or concerns. [facility] with [facility] personal belongings in no apparent distress. Transportation per family (father).   Physical Findings: AIMS: Facial and Oral Movements Muscles of Facial Expression: None, normal Lips and Perioral Area: None, normal Jaw: None, normal Tongue: None, normal,Extremity Movements Upper (arms, wrists, hands, fingers): None, normal Lower (legs, knees, ankles, toes): None, normal, Trunk Movements Neck, shoulders, hips: None, normal, Overall Severity Severity of abnormal movements (highest score from questions above): None, normal Incapacitation due to abnormal movements: None, normal Patient's awareness of abnormal movements (rate only patient's report): No Awareness, Dental Status Current problems with teeth and/or dentures?: No Does patient usually wear dentures?: No  CIWA:   CIWA-Ar Total: 10 COWS:     Musculoskeletal: Strength & Muscle Tone: within normal limits Gait & Station: normal Patient leans: N/A  Psychiatric Specialty Exam:  Presentation  General Appearance: Casual  Eye Contact:Good  Speech:Clear and Coherent; Normal Rate  Speech Volume:Normal  Handedness: No data recorded  Mood and Affect  Mood:Euthymic  Affect:Appropriate; Congruent  Thought Process  Thought Processes:Coherent; Goal Directed  Descriptions of Associations:Intact  Orientation:Full (Time, Place and Person)  Thought Content:Logical; WDL  History of Schizophrenia/Schizoaffective disorder:No  Duration of Psychotic Symptoms:No data recorded Hallucinations:Hallucinations: None  Ideas of Reference:None  Suicidal Thoughts:Suicidal Thoughts: No  Homicidal Thoughts:Homicidal Thoughts: No  Sensorium  Memory:Immediate Good; Recent Good  Judgment:Fair  Insight:Good  Executive Functions  Concentration:Good  Attention Span:Good  Recall:Good  Fund of Knowledge:Good  Language:Good  Psychomotor Activity  Psychomotor Activity:Psychomotor Activity: Normal  Assets  Assets:Communication Skills; Desire for Improvement; Housing; Health and safety inspectorinancial Resources/Insurance; Social Support; Transportation  Sleep  Sleep:Sleep: Good Number of Hours of Sleep: 6.25  Physical Exam: Physical Exam Vitals and nursing note reviewed.  HENT:     Head: Normocephalic.     Nose:  Nose normal.     Mouth/Throat:     Pharynx: Oropharynx is clear.  Eyes:     Pupils: Pupils are equal, round, and reactive to light.  Cardiovascular:     Rate and Rhythm: Normal rate.     Pulses: Normal pulses.  Pulmonary:     Effort: Pulmonary effort is normal.  Genitourinary:    Comments: Deferred Musculoskeletal:        General: Normal range of motion.     Cervical back: Normal range of motion.  Skin:    General: Skin is warm and dry.  Neurological:     General: No focal deficit present.      Mental Status: He is alert and oriented to person, place, and time. Mental status is at baseline.  Psychiatric:        Mood and Affect: Mood normal. Mood is not anxious, depressed or elated. Affect is not labile, blunt, flat, angry, tearful or inappropriate.        Speech: Speech normal.        Behavior: Behavior normal. Behavior is not agitated, slowed, aggressive, withdrawn, hyperactive or combative. Behavior is cooperative.        Thought Content: Thought content normal. Thought content is not paranoid or delusional. Thought content does not include homicidal or suicidal ideation. Thought content does not include homicidal or suicidal plan.        Cognition and Memory: Cognition and memory normal.        Judgment: Judgment normal.   Review of Systems  Constitutional: Negative.   HENT: Negative.  Negative for congestion and sore throat.   Eyes: Negative.   Respiratory:  Negative for cough, shortness of breath and wheezing.   Cardiovascular:  Negative for chest pain and palpitations.  Gastrointestinal:  Negative for abdominal pain, constipation, diarrhea, heartburn, nausea and vomiting.  Genitourinary:  Negative for dysuria.  Musculoskeletal:  Negative for joint pain and myalgias.  Skin: Negative.   Neurological:  Negative for dizziness, tingling, tremors, sensory change, speech change, focal weakness, seizures, loss of consciousness, weakness and headaches.  Endo/Heme/Allergies:  Positive for environmental allergies (Hx. seasonal allergies (stable)). Does not bruise/bleed easily.  Psychiatric/Behavioral:  Positive for depression (Hx. of (stable on medication)) and substance abuse (Hx. Alcohol/cocaine use disorder). Negative for hallucinations, memory loss and suicidal ideas. The patient has insomnia (Hx of (stable on medication)). The patient is not nervous/anxious (Stable upon discharge).   Blood pressure 122/89, pulse (!) 110, temperature 98.5 F (36.9 C), temperature source Oral, resp.  rate 18, height 6\' 4"  (1.93 m), weight 102.1 kg, SpO2 100 %. Body mass index is 27.39 kg/m.   Social History   Tobacco Use  Smoking Status Every Day   Types: Cigarettes  Smokeless Tobacco Never   Tobacco Cessation:  A prescription for an FDA-approved tobacco cessation medication provided at discharge  Blood Alcohol level:  Lab Results  Component Value Date   ETH 174 (H) 03/11/2021   ETH <10 11/25/2017   Metabolic Disorder Labs:  Lab Results  Component Value Date   HGBA1C 5.4 03/13/2021   MPG 108 03/13/2021   No results found for: PROLACTIN Lab Results  Component Value Date   CHOL 170 03/13/2021   TRIG 330 (H) 03/13/2021   HDL 32 (L) 03/13/2021   CHOLHDL 5.3 03/13/2021   VLDL 66 (H) 03/13/2021   LDLCALC 72 03/13/2021   See Psychiatric Specialty Exam and Suicide Risk Assessment completed by Attending Physician prior to discharge.  Discharge destination:  Home  Is patient on multiple antipsychotic therapies at discharge:  No   Has Patient had three or more failed trials of antipsychotic monotherapy by history:  No  Recommended Plan for Multiple Antipsychotic Therapies: NA  Allergies as of 03/16/2021       Reactions   Haldol [haloperidol] Other (See Comments)   Unknown, he sates his "mouth went sideways."        Medication List     STOP taking these medications    hydrOXYzine 50 MG capsule Commonly known as: VISTARIL       TAKE these medications      Indication  gabapentin 100 MG capsule Commonly known as: NEURONTIN Take 2 capsules (200 mg total) by mouth 3 (three) times daily. For agitation/substance withdrawal syndrome  Indication: Alcohol Withdrawal Syndrome, Agitiation   lamoTRIgine 25 MG tablet Commonly known as: LAMICTAL Take 1 tablet (25 mg total) by mouth daily. For mood stabilization  Indication: Mood stabilization   levothyroxine 88 MCG tablet Commonly known as: SYNTHROID Take 1 tablet (88 mcg total) by mouth daily before breakfast.  For hypothyroidism  Indication: Underactive Thyroid   loratadine 10 MG tablet Commonly known as: CLARITIN Take 1 tablet (10 mg total) by mouth daily. (May buy from over the counter): For allergies Start taking on: March 17, 2021  Indication: Hayfever   neomycin-bacitracin-polymyxin Oint Commonly known as: NEOSPORIN Apply 1 application topically 3 (three) times daily. (May buy from over the counter): For wound care  Indication: Wound care   nicotine 21 mg/24hr patch Commonly known as: NICODERM CQ - dosed in mg/24 hours Place 1 patch (21 mg total) onto the skin daily. (May buy from over the counter): For smoking cessation Start taking on: March 17, 2021  Indication: Nicotine Addiction   omeprazole 40 MG capsule Commonly known as: PRILOSEC Take 40 mg by mouth every morning.  Indication: Gastroesophageal Reflux Disease   prazosin 2 MG capsule Commonly known as: MINIPRESS Take 1 capsule (2 mg total) by mouth at bedtime. Fr nightmares  Indication: Frightening Dreams   traZODone 50 MG tablet Commonly known as: DESYREL Take 1 tablet (50 mg total) by mouth at bedtime as needed for sleep.  Indication: Trouble Sleeping   Trintellix 20 MG Tabs tablet Generic drug: vortioxetine HBr Take 1 tablet (20 mg total) by mouth daily. For depression What changed: additional instructions  Indication: Major Depressive Disorder        Follow-up Information     [facility] Call.   Why: A referral has been made to this provider for DBT therapy services.  Please contact the provider to schedule an appointment. Contact information: [address] [place] [address] [phone]         Clapacs, Jackquline Denmark, MD Follow up.   Specialty: Psychiatry Why: You may contact this provider if you wish to pursue ECT therapy services. Contact information: [address] [place] [address] [phone]                Follow-up recommendations:  Activity:  As tolerated Diet: As recommended by your primary care doctor. Keep all scheduled follow-up appointments as recommended.    Comments: Prescriptions given at discharge.  Patient agreeable to plan.  Given opportunity to ask questions.  Appears to feel comfortable with discharge denies any current suicidal or homicidal thought. Patient is also instructed prior to discharge to: Take all medications as prescribed by his/her mental healthcare provider. Report any adverse effects and or reactions  from the medicines to his/her outpatient provider promptly. Patient has been instructed & cautioned: To not engage in alcohol and or illegal drug use while on prescription medicines. In the event of worsening symptoms, patient is instructed to call the crisis hotline, 911 and or go to the nearest ED for appropriate evaluation and treatment of symptoms. To follow-up with his/her primary care provider for your other medical issues, concerns and or health care needs.   Signed: Armandina Stammer, NP, pmhnp, fnp-bc 03/16/2021, 10:01 AM
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
## 2021-03-16 NOTE — Plan of Care (Signed)
Nurse discussed anxiety, depression and coping skills with patient.  

## 2021-03-16 NOTE — Progress Notes (Signed)
Adult Psychoeducational Group Note  Date:  03/16/2021 Time:  11:26 AM  Group Topic/Focus:  Goals Group:   The focus of this group is to help patients establish daily goals to achieve during treatment and discuss how the patient can incorporate goal setting into their daily lives to aide in recovery.  Participation Level:  Did Not Attend [facility] [facility] 03/16/2021, 11:26 AM

## 2021-03-16 NOTE — Progress Notes (Signed)
  [facility] Adult Case Management Discharge Plan :  Will you be returning to the same living situation after discharge:  Yes,  Home with father  At discharge, do you have transportation home?: Yes,  Father  Do you have the ability to pay for your medications: Yes,  Insurance   Release of information consent forms completed and in the chart;  Patient's signature needed at discharge.  Patient to Follow up at:  Follow-up Information     [facility] Call.   Why: A referral has been made to this provider for DBT therapy services.  Please contact the provider to schedule an appointment. Contact information: [address] [place] [address] [phone]         Clapacs, Jackquline Denmark, MD Follow up.   Specialty: Psychiatry Why: You may contact this provider if you wish to pursue ECT therapy services. Contact information: [address] [place] [address] [phone]                 Next level of care provider has access to [facility] Link:no  Safety Planning and Suicide Prevention discussed: Yes,  with patient and father      Has patient been referred to the Quitline?: Patient refused referral  Patient has been referred for addiction treatment: Pt. refused referral  Aram Beecham, LCSWA 03/16/2021, 9:40 AM

## 2021-03-16 NOTE — Tx Team (Signed)
Interdisciplinary Treatment and Diagnostic Plan Update  03/16/2021 Time of Session: 1:15pm [name] MRN: [id number]  Principal Diagnosis: MDD (major depressive disorder), recurrent severe, without psychosis (HCC)  Secondary Diagnoses: Principal Problem:   MDD (major depressive disorder), recurrent severe, without psychosis (HCC) Active Problems:   Alcohol dependence with withdrawal with complication (HCC)   Current Medications:  Current Facility-Administered Medications  Medication Dose Route Frequency Provider Last Rate Last Admin   acetaminophen (TYLENOL) tablet 650 mg  650 mg Oral Q6H PRN Bobbitt, Shalon E, NP   650 mg at 03/14/21 0854   alum & mag hydroxide-simeth (MAALOX/MYLANTA) 200-200-20 MG/5ML suspension 30 mL  30 mL Oral Q4H PRN Bobbitt, Shalon E, NP   30 mL at 03/15/21 1123   chlordiazePOXIDE (LIBRIUM) capsule 25 mg  25 mg Oral Daily Brandon Revering, MD       gabapentin (NEURONTIN) capsule 200 mg  200 mg Oral TID Brandon [name] I, NP   200 mg at 03/16/21 0747   lamoTRIgine (LAMICTAL) tablet 25 mg  25 mg Oral Daily Brandon Revering, MD       levothyroxine (SYNTHROID) tablet 88 mcg  88 mcg Oral QAC breakfast Bobbitt, Shalon E, NP   88 mcg at 03/16/21 0644   loratadine (CLARITIN) tablet 10 mg  10 mg Oral Daily Brandon Revering, MD   10 mg at 03/16/21 0750   magnesium hydroxide (MILK OF MAGNESIA) suspension 30 mL  30 mL Oral Daily PRN Bobbitt, Shalon E, NP   30 mL at 03/13/21 2058   multivitamin with minerals tablet 1 tablet  1 tablet Oral Daily Bobbitt, Shalon E, NP   1 tablet at 03/16/21 0748   neomycin-bacitracin-polymyxin (NEOSPORIN) ointment   Topical TID Brandon Revering, MD   Given at 03/16/21 [phone]   nicotine (NICODERM CQ - dosed in mg/24 hours) patch 21 mg  21 mg Transdermal Daily Brandon [name], Brandon E, MD   21 mg at 03/16/21 0749   OLANZapine zydis (ZYPREXA) disintegrating tablet 5 mg  5 mg Oral Q8H PRN Brandon [name] I, NP       And   ziprasidone (GEODON) injection 20 mg  20  mg Intramuscular PRN Brandon [name] I, NP       pantoprazole (PROTONIX) EC tablet 40 mg  40 mg Oral Daily Bobbitt, Shalon E, NP   40 mg at 03/16/21 0747   prazosin (MINIPRESS) capsule 2 mg  2 mg Oral QHS Brandon Revering, MD   2 mg at 03/15/21 2111   thiamine tablet 100 mg  100 mg Oral Daily Bobbitt, Shalon E, NP   100 mg at 03/16/21 0749   traZODone (DESYREL) tablet 50 mg  50 mg Oral QHS PRN,MR X 1 Brandon Revering, MD   50 mg at 03/13/21 2216   vortioxetine HBr (TRINTELLIX) tablet 20 mg  20 mg Oral Daily Bobbitt, Shalon E, NP   20 mg at 03/16/21 5102   PTA Medications: Medications Prior to Admission  Medication Sig Dispense Refill Last Dose   hydrOXYzine (VISTARIL) 50 MG capsule Take 50-100 mg by mouth every 8 (eight) hours as needed.      levothyroxine (SYNTHROID, LEVOTHROID) 88 MCG tablet Take 1 tablet (88 mcg total) by mouth daily before breakfast. For hypothyroidism 30 tablet 0    omeprazole (PRILOSEC) 40 MG capsule Take 40 mg by mouth every morning.      TRINTELLIX 20 MG TABS tablet Take 20 mg by mouth daily.       Patient Stressors:  Financial difficulties Marital or family conflict Substance abuse  Patient Strengths: Ability for insight General fund of knowledge Motivation for treatment/growth Supportive family/friends  Treatment Modalities: Medication Management, Group therapy, Case management,  1 to 1 session with clinician, Psychoeducation, Recreational therapy.   Physician Treatment Plan for Primary Diagnosis: MDD (major depressive disorder), recurrent severe, without psychosis (HCC) Long Term Goal(s): Improvement in symptoms so as ready for discharge   Short Term Goals: Ability to identify changes in lifestyle to reduce recurrence of condition will improve Ability to verbalize feelings will improve Ability to disclose and discuss suicidal ideas Ability to demonstrate self-control will improve Ability to identify and develop effective coping behaviors will improve Ability  to maintain clinical measurements within normal limits will improve Compliance with prescribed medications will improve Ability to identify triggers associated with substance abuse/mental health issues will improve  Medication Management: Evaluate patient's response, side effects, and tolerance of medication regimen.  Therapeutic Interventions: 1 to 1 sessions, Unit Group sessions and Medication administration.  Evaluation of Outcomes: Adequate for Discharge  Physician Treatment Plan for Secondary Diagnosis: Principal Problem:   MDD (major depressive disorder), recurrent severe, without psychosis (HCC) Active Problems:   Alcohol dependence with withdrawal with complication (HCC)  Long Term Goal(s): Improvement in symptoms so as ready for discharge   Short Term Goals: Ability to identify changes in lifestyle to reduce recurrence of condition will improve Ability to verbalize feelings will improve Ability to disclose and discuss suicidal ideas Ability to demonstrate self-control will improve Ability to identify and develop effective coping behaviors will improve Ability to maintain clinical measurements within normal limits will improve Compliance with prescribed medications will improve Ability to identify triggers associated with substance abuse/mental health issues will improve     Medication Management: Evaluate patient's response, side effects, and tolerance of medication regimen.  Therapeutic Interventions: 1 to 1 sessions, Unit Group sessions and Medication administration.  Evaluation of Outcomes: Adequate for Discharge   RN Treatment Plan for Primary Diagnosis: MDD (major depressive disorder), recurrent severe, without psychosis (HCC) Long Term Goal(s): Knowledge of disease and therapeutic regimen to maintain health will improve  Short Term Goals: Ability to remain free from injury will improve, Ability to verbalize frustration and anger appropriately will improve, Ability  to demonstrate self-control, Ability to participate in decision making will improve, Ability to verbalize feelings will improve, Ability to identify and develop effective coping behaviors will improve, and Compliance with prescribed medications will improve  Medication Management: RN will administer medications as ordered by provider, will assess and evaluate patient's response and provide education to patient for prescribed medication. RN will report any adverse and/or side effects to prescribing provider.  Therapeutic Interventions: 1 on 1 counseling sessions, Psychoeducation, Medication administration, Evaluate responses to treatment, Monitor vital signs and CBGs as ordered, Perform/monitor CIWA, COWS, AIMS and Fall Risk screenings as ordered, Perform wound care treatments as ordered.  Evaluation of Outcomes: Adequate for Discharge   LCSW Treatment Plan for Primary Diagnosis: MDD (major depressive disorder), recurrent severe, without psychosis (HCC) Long Term Goal(s): Safe transition to appropriate next level of care at discharge, Engage patient in therapeutic group addressing interpersonal concerns.  Short Term Goals: Engage patient in aftercare planning with referrals and resources, Increase social support, Increase ability to appropriately verbalize feelings, Increase emotional regulation, Facilitate patient progression through stages of change regarding substance use diagnoses and concerns, Identify triggers associated with mental health/substance abuse issues, and Increase skills for wellness and recovery  Therapeutic Interventions: Assess for all discharge  needs, 1 to 1 time with Child psychotherapist, Explore available resources and support systems, Assess for adequacy in community support network, Educate family and significant other(s) on suicide prevention, Complete Psychosocial Assessment, Interpersonal group therapy.  Evaluation of Outcomes: Adequate for Discharge   Progress in  Treatment: Attending groups: Yes. Participating in groups: Yes. Taking medication as prescribed: Yes. Toleration medication: Yes. Family/Significant other contact made: Yes, individual(s) contacted:  father Patient understands diagnosis: Yes. Discussing patient identified problems/goals with staff: Yes. Medical problems stabilized or resolved: Yes. Denies suicidal/homicidal ideation: Yes. Issues/concerns per patient self-inventory: No.   New problem(s) identified: No, Describe:  none  New Short Term/Long Term Goal(s): detox, medication management for mood stabilization; elimination of SI thoughts; development of comprehensive mental wellness/sobriety plan  Patient Goals:  "To detox, stabilize and go to treatment"  Discharge Plan or Barriers: Patient recently admitted. CSW will continue to follow and assess for appropriate referrals and possible discharge planning.    Reason for Continuation of Hospitalization: Anxiety Depression Medication stabilization Suicidal ideation Withdrawal symptoms  Estimated Length of Stay: 3-5 days  Attendees: Patient: Brandon [name]  03/12/2021   Physician: Arna Snipe, DO 03/12/2021   Nursing:  03/12/2021   RN Care Manager: 03/12/2021   Social Worker: Ruthann Cancer, LCSW 03/12/2021   Recreational Therapist:  03/12/2021  Other:  03/12/2021   Other:  03/12/2021   Other: 03/12/2021     Scribe for Treatment Team: Felizardo Hoffmann, LCSWA 03/16/2021 9:24 AM

## 2021-03-16 NOTE — Progress Notes (Signed)
Recreation Therapy Notes  Date:  7.29.22 Time: 0930 Location: [address] Dayroom  Group Topic: Stress Management  Goal Area(s) Addresses:  Patient will identify positive stress management techniques. Patient will identify benefits of using stress management post d/c.  Intervention: Stress Management  Activity :  LRT played a meditation that focused on dealing with procrastination.  Patients were to listen to the meditation as it played.  Education:  Stress Management, Discharge Planning.   Education Outcome: Acknowledges Education  Clinical Observations/Feedback:  Pt did not attend group session.    Ohanna Gassert, LRT/CTRS         Livana Yerian A 03/16/2021 12:36 PM
